# Patient Record
Sex: Female | Born: 1970 | Race: Black or African American | Hispanic: No | Marital: Married | Smoking: Never smoker
Health system: Southern US, Community
[De-identification: ages and names within clinical notes are randomized; demographics above are authoritative.]

## PROBLEM LIST (undated history)

## (undated) ENCOUNTER — Emergency Department (HOSPITAL_COMMUNITY): Admission: EM | Payer: Self-pay | Source: Home / Self Care

## (undated) DIAGNOSIS — I10 Essential (primary) hypertension: Secondary | ICD-10-CM

## (undated) DIAGNOSIS — B1910 Unspecified viral hepatitis B without hepatic coma: Secondary | ICD-10-CM

## (undated) HISTORY — DX: Unspecified viral hepatitis B without hepatic coma: B19.10

---

## 1999-06-20 ENCOUNTER — Emergency Department (HOSPITAL_COMMUNITY): Admission: EM | Admit: 1999-06-20 | Discharge: 1999-06-20 | Payer: Self-pay | Admitting: Emergency Medicine

## 1999-07-25 ENCOUNTER — Encounter: Admission: RE | Admit: 1999-07-25 | Discharge: 1999-07-25 | Payer: Self-pay | Admitting: Internal Medicine

## 1999-08-24 ENCOUNTER — Encounter: Admission: RE | Admit: 1999-08-24 | Discharge: 1999-08-24 | Payer: Self-pay | Admitting: Internal Medicine

## 1999-09-02 ENCOUNTER — Encounter: Admission: RE | Admit: 1999-09-02 | Discharge: 1999-09-02 | Payer: Self-pay | Admitting: Internal Medicine

## 2000-02-20 ENCOUNTER — Emergency Department (HOSPITAL_COMMUNITY): Admission: EM | Admit: 2000-02-20 | Discharge: 2000-02-20 | Payer: Self-pay | Admitting: Emergency Medicine

## 2000-02-20 ENCOUNTER — Encounter: Payer: Self-pay | Admitting: Emergency Medicine

## 2000-03-23 ENCOUNTER — Encounter: Admission: RE | Admit: 2000-03-23 | Discharge: 2000-03-23 | Payer: Self-pay | Admitting: Internal Medicine

## 2000-04-05 ENCOUNTER — Other Ambulatory Visit: Admission: RE | Admit: 2000-04-05 | Discharge: 2000-04-05 | Payer: Self-pay | Admitting: Gynecology

## 2000-10-30 ENCOUNTER — Inpatient Hospital Stay (HOSPITAL_COMMUNITY): Admission: AD | Admit: 2000-10-30 | Discharge: 2000-11-01 | Payer: Self-pay | Admitting: Obstetrics and Gynecology

## 2001-01-09 ENCOUNTER — Encounter: Admission: RE | Admit: 2001-01-09 | Discharge: 2001-01-09 | Payer: Self-pay | Admitting: Family Medicine

## 2002-02-26 ENCOUNTER — Encounter: Admission: RE | Admit: 2002-02-26 | Discharge: 2002-02-26 | Payer: Self-pay | Admitting: Family Medicine

## 2002-11-27 ENCOUNTER — Encounter: Admission: RE | Admit: 2002-11-27 | Discharge: 2002-11-27 | Payer: Self-pay | Admitting: Family Medicine

## 2002-12-26 ENCOUNTER — Encounter: Admission: RE | Admit: 2002-12-26 | Discharge: 2002-12-26 | Payer: Self-pay | Admitting: Family Medicine

## 2003-01-16 ENCOUNTER — Encounter: Admission: RE | Admit: 2003-01-16 | Discharge: 2003-01-16 | Payer: Self-pay | Admitting: Family Medicine

## 2003-02-16 ENCOUNTER — Ambulatory Visit (HOSPITAL_COMMUNITY): Admission: RE | Admit: 2003-02-16 | Discharge: 2003-02-16 | Payer: Self-pay | Admitting: Family Medicine

## 2003-02-16 ENCOUNTER — Encounter: Admission: RE | Admit: 2003-02-16 | Discharge: 2003-02-16 | Payer: Self-pay | Admitting: Family Medicine

## 2003-03-17 ENCOUNTER — Encounter: Admission: RE | Admit: 2003-03-17 | Discharge: 2003-03-17 | Payer: Self-pay | Admitting: Family Medicine

## 2003-05-06 ENCOUNTER — Encounter: Admission: RE | Admit: 2003-05-06 | Discharge: 2003-05-06 | Payer: Self-pay | Admitting: Family Medicine

## 2003-05-08 ENCOUNTER — Encounter: Admission: RE | Admit: 2003-05-08 | Discharge: 2003-05-08 | Payer: Self-pay | Admitting: Family Medicine

## 2003-05-14 ENCOUNTER — Encounter: Admission: RE | Admit: 2003-05-14 | Discharge: 2003-05-14 | Payer: Self-pay | Admitting: Sports Medicine

## 2003-05-20 ENCOUNTER — Encounter: Admission: RE | Admit: 2003-05-20 | Discharge: 2003-05-20 | Payer: Self-pay | Admitting: Family Medicine

## 2003-06-05 ENCOUNTER — Encounter: Admission: RE | Admit: 2003-06-05 | Discharge: 2003-06-05 | Payer: Self-pay | Admitting: Family Medicine

## 2003-06-10 ENCOUNTER — Encounter: Admission: RE | Admit: 2003-06-10 | Discharge: 2003-06-10 | Payer: Self-pay | Admitting: Family Medicine

## 2003-06-12 ENCOUNTER — Ambulatory Visit (HOSPITAL_COMMUNITY): Admission: RE | Admit: 2003-06-12 | Discharge: 2003-06-12 | Payer: Self-pay | Admitting: Family Medicine

## 2003-06-16 ENCOUNTER — Encounter: Admission: RE | Admit: 2003-06-16 | Discharge: 2003-06-16 | Payer: Self-pay | Admitting: Family Medicine

## 2003-06-24 ENCOUNTER — Encounter: Admission: RE | Admit: 2003-06-24 | Discharge: 2003-06-24 | Payer: Self-pay | Admitting: Family Medicine

## 2003-06-30 ENCOUNTER — Inpatient Hospital Stay (HOSPITAL_COMMUNITY): Admission: AD | Admit: 2003-06-30 | Discharge: 2003-06-30 | Payer: Self-pay | Admitting: *Deleted

## 2003-07-07 ENCOUNTER — Encounter: Admission: RE | Admit: 2003-07-07 | Discharge: 2003-07-07 | Payer: Self-pay | Admitting: Family Medicine

## 2003-07-11 ENCOUNTER — Inpatient Hospital Stay (HOSPITAL_COMMUNITY): Admission: AD | Admit: 2003-07-11 | Discharge: 2003-07-13 | Payer: Self-pay | Admitting: *Deleted

## 2003-08-26 ENCOUNTER — Encounter: Admission: RE | Admit: 2003-08-26 | Discharge: 2003-08-26 | Payer: Self-pay | Admitting: Family Medicine

## 2003-09-11 ENCOUNTER — Encounter: Admission: RE | Admit: 2003-09-11 | Discharge: 2003-09-11 | Payer: Self-pay | Admitting: Sports Medicine

## 2003-11-13 ENCOUNTER — Encounter: Admission: RE | Admit: 2003-11-13 | Discharge: 2003-11-13 | Payer: Self-pay | Admitting: Family Medicine

## 2004-02-17 ENCOUNTER — Encounter: Admission: RE | Admit: 2004-02-17 | Discharge: 2004-02-17 | Payer: Self-pay | Admitting: Family Medicine

## 2004-02-23 ENCOUNTER — Encounter (INDEPENDENT_AMBULATORY_CARE_PROVIDER_SITE_OTHER): Payer: Self-pay | Admitting: *Deleted

## 2004-03-22 ENCOUNTER — Encounter: Admission: RE | Admit: 2004-03-22 | Discharge: 2004-03-22 | Payer: Self-pay | Admitting: Sports Medicine

## 2004-03-22 ENCOUNTER — Encounter (INDEPENDENT_AMBULATORY_CARE_PROVIDER_SITE_OTHER): Payer: Self-pay | Admitting: Specialist

## 2004-03-31 ENCOUNTER — Encounter: Admission: RE | Admit: 2004-03-31 | Discharge: 2004-03-31 | Payer: Self-pay | Admitting: Family Medicine

## 2004-05-05 ENCOUNTER — Encounter: Admission: RE | Admit: 2004-05-05 | Discharge: 2004-05-05 | Payer: Self-pay | Admitting: Sports Medicine

## 2004-05-10 ENCOUNTER — Encounter: Admission: RE | Admit: 2004-05-10 | Discharge: 2004-05-10 | Payer: Self-pay | Admitting: Family Medicine

## 2005-02-08 ENCOUNTER — Ambulatory Visit: Payer: Self-pay | Admitting: Sports Medicine

## 2005-02-24 ENCOUNTER — Encounter: Admission: RE | Admit: 2005-02-24 | Discharge: 2005-02-24 | Payer: Self-pay | Admitting: Sports Medicine

## 2005-02-24 ENCOUNTER — Ambulatory Visit: Payer: Self-pay | Admitting: Family Medicine

## 2005-09-05 ENCOUNTER — Ambulatory Visit: Payer: Self-pay | Admitting: Family Medicine

## 2007-01-26 ENCOUNTER — Emergency Department (HOSPITAL_COMMUNITY): Admission: EM | Admit: 2007-01-26 | Discharge: 2007-01-27 | Payer: Self-pay | Admitting: Emergency Medicine

## 2007-02-22 ENCOUNTER — Encounter (INDEPENDENT_AMBULATORY_CARE_PROVIDER_SITE_OTHER): Payer: Self-pay | Admitting: *Deleted

## 2007-11-05 ENCOUNTER — Emergency Department (HOSPITAL_COMMUNITY): Admission: EM | Admit: 2007-11-05 | Discharge: 2007-11-05 | Payer: Self-pay | Admitting: Emergency Medicine

## 2008-03-30 ENCOUNTER — Emergency Department (HOSPITAL_COMMUNITY): Admission: EM | Admit: 2008-03-30 | Discharge: 2008-03-30 | Payer: Self-pay | Admitting: Emergency Medicine

## 2008-09-02 ENCOUNTER — Telehealth: Payer: Self-pay | Admitting: *Deleted
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
## 2010-11-23 ENCOUNTER — Emergency Department (HOSPITAL_COMMUNITY)
Admission: EM | Admit: 2010-11-23 | Discharge: 2010-11-23 | Payer: Self-pay | Source: Home / Self Care | Admitting: Emergency Medicine

## 2011-01-21 ENCOUNTER — Emergency Department (HOSPITAL_COMMUNITY)
Admission: EM | Admit: 2011-01-21 | Discharge: 2011-01-21 | Payer: Self-pay | Source: Home / Self Care | Admitting: Emergency Medicine

## 2011-01-21 LAB — POCT I-STAT, CHEM 8
BUN: 10 mg/dL (ref 6–23)
Calcium, Ion: 1.19 mmol/L (ref 1.12–1.32)
Chloride: 107 mEq/L (ref 96–112)
Creatinine, Ser: 1.1 mg/dL (ref 0.4–1.2)
TCO2: 24 mmol/L (ref 0–100)

## 2011-10-03 LAB — POCT PREGNANCY, URINE: Preg Test, Ur: NEGATIVE

## 2011-10-10 ENCOUNTER — Inpatient Hospital Stay (INDEPENDENT_AMBULATORY_CARE_PROVIDER_SITE_OTHER)
Admission: RE | Admit: 2011-10-10 | Discharge: 2011-10-10 | Disposition: A | Discharge status: Home / Self Care | Payer: PRIVATE HEALTH INSURANCE | Source: Ambulatory Visit | Attending: Family Medicine | Admitting: Family Medicine

## 2011-10-10 DIAGNOSIS — I10 Essential (primary) hypertension: Secondary | ICD-10-CM

## 2011-10-10 LAB — POCT URINALYSIS DIP (DEVICE)
Hgb urine dipstick: NEGATIVE
Leukocytes, UA: NEGATIVE

## 2011-10-10 LAB — POCT I-STAT, CHEM 8
BUN: 13 mg/dL (ref 6–23)
Calcium, Ion: 1.22 mmol/L (ref 1.12–1.32)
Creatinine, Ser: 0.8 mg/dL (ref 0.50–1.10)
HCT: 39 % (ref 36.0–46.0)

## 2011-12-04 ENCOUNTER — Emergency Department (INDEPENDENT_AMBULATORY_CARE_PROVIDER_SITE_OTHER)
Admission: EM | Admit: 2011-12-04 | Discharge: 2011-12-04 | Disposition: A | Discharge status: Home / Self Care | Payer: PRIVATE HEALTH INSURANCE | Source: Home / Self Care | Attending: Family Medicine | Admitting: Family Medicine

## 2011-12-04 DIAGNOSIS — J329 Chronic sinusitis, unspecified: Secondary | ICD-10-CM

## 2011-12-04 HISTORY — DX: Essential (primary) hypertension: I10

## 2011-12-04 MED ORDER — GUAIFENESIN-CODEINE 100-10 MG/5ML PO SYRP: 10.0000 mL | ORAL_SOLUTION | Freq: Three times a day (TID) | ORAL | Status: AC | PRN

## 2011-12-04 MED ORDER — AZITHROMYCIN 250 MG PO TABS: ORAL_TABLET | ORAL | Status: AC

## 2011-12-04 MED ORDER — IPRATROPIUM BROMIDE 0.06 % NA SOLN: 2.0000 | Freq: Four times a day (QID) | NASAL | Status: DC

## 2011-12-04 NOTE — ED Provider Notes (Signed)
History     CSN: [id number] Arrival date & time: 12/04/2011  5:58 PM   First MD Initiated Contact with Patient 12/04/11 1725      No chief complaint on file.   (Consider location/radiation/quality/duration/timing/severity/associated sxs/prior treatment) Patient is a 40 y.o. female presenting with cough.  Cough This is a new problem. Episode onset: 4 weeks. The problem occurs constantly. The problem has not changed since onset.The cough is productive of purulent sputum. There has been no fever. Associated symptoms include rhinorrhea. Pertinent negatives include no shortness of breath and no wheezing. She has tried decongestants and cough syrup for the symptoms. The treatment provided mild relief. She is not a smoker.    No past medical history on file.  No past surgical history on file.  No family history on file.  History  Substance Use Topics  . Smoking status: Not on file  . Smokeless tobacco: Not on file  . Alcohol Use: Not on file    OB History    No data available      Review of Systems  Constitutional: Negative.   HENT: Positive for congestion, rhinorrhea and postnasal drip.   Respiratory: Positive for cough. Negative for shortness of breath and wheezing.   Cardiovascular: Negative.   Gastrointestinal: Negative for nausea, vomiting and diarrhea.  Skin: Negative.     Allergies  Review of patient's allergies indicates not on file.  Home Medications   Current Outpatient Rx  Name Route Sig Dispense Refill  . AZITHROMYCIN 250 MG PO TABS  Take as directed on pack 6 each 0  . GUAIFENESIN-CODEINE 100-10 MG/5ML PO SYRP Oral Take 10 mLs by mouth 3 (three) times daily as needed for cough. 180 mL 0  . IPRATROPIUM BROMIDE 0.06 % NA SOLN Nasal Place 2 sprays into the nose 4 (four) times daily. 15 mL 12    There were no vitals taken for this visit.  Physical Exam  Nursing note and vitals reviewed. Constitutional: She appears well-developed and well-nourished.    HENT:  Head: Normocephalic.  Right Ear: External ear normal.  Left Ear: External ear normal.  Mouth/Throat: Oropharynx is clear and moist.  Eyes: Conjunctivae and EOM are normal. Pupils are equal, round, and reactive to light.  Neck: Normal range of motion. Neck supple.  Cardiovascular: Normal rate, normal heart sounds and intact distal pulses.   Pulmonary/Chest: Effort normal and breath sounds normal. She has no wheezes. She has no rales.  Abdominal: Soft. Bowel sounds are normal.  Skin: Skin is warm and dry.    ED Course  Procedures (including critical care time)  Labs Reviewed - No data to display No results found.   1. Sinusitis       MDM          Barkley Bruns, MD 12/04/11 [phone]

## 2011-12-04 NOTE — ED Notes (Signed)
Sick for 1 month

## 2013-01-01 ENCOUNTER — Encounter (HOSPITAL_COMMUNITY): Payer: Self-pay

## 2013-01-01 ENCOUNTER — Emergency Department (HOSPITAL_COMMUNITY)
Admission: EM | Admit: 2013-01-01 | Discharge: 2013-01-01 | Disposition: A | Discharge status: Home / Self Care | Payer: Commercial Managed Care - PPO | Attending: Emergency Medicine | Admitting: Emergency Medicine

## 2013-01-01 ENCOUNTER — Emergency Department (HOSPITAL_COMMUNITY): Payer: Commercial Managed Care - PPO

## 2013-01-01 DIAGNOSIS — I10 Essential (primary) hypertension: Secondary | ICD-10-CM | POA: Insufficient documentation

## 2013-01-01 DIAGNOSIS — R071 Chest pain on breathing: Secondary | ICD-10-CM | POA: Insufficient documentation

## 2013-01-01 DIAGNOSIS — K219 Gastro-esophageal reflux disease without esophagitis: Secondary | ICD-10-CM

## 2013-01-01 MED ORDER — HYDROCODONE-ACETAMINOPHEN 5-325 MG PO TABS: 1.0000 | ORAL_TABLET | Freq: Four times a day (QID) | ORAL | Status: DC | PRN

## 2013-01-01 MED ORDER — GI COCKTAIL ~~LOC~~
30.0000 mL | Freq: Once | ORAL | Status: AC
  Administered 2013-01-01: 30 mL via ORAL
  Filled 2013-01-01: qty 30

## 2013-01-01 MED ORDER — LANSOPRAZOLE 30 MG PO CPDR: 30.0000 mg | DELAYED_RELEASE_CAPSULE | Freq: Every day | ORAL | Status: DC

## 2013-01-01 NOTE — ED Notes (Signed)
Pt sts that it is indigestion, hurts when she tries to eat any kind of food, and makes her burp.

## 2013-01-01 NOTE — ED Notes (Signed)
Pt reports "there is still this 'scraping-feeling' in my chest when I swallow".

## 2013-01-01 NOTE — ED Notes (Signed)
Pt states that when she tries to swallow it is difficult.  Causing epigastric pain with food.  Has worsened today.  Feels that food is not going down the way it should. No reflux meds.  No pain down either arm.  Pain does go to the back but nothing down arms.

## 2013-01-01 NOTE — ED Provider Notes (Signed)
History    CSN: [id number] Arrival date & time 01/01/13  2130 First MD Initiated Contact with Patient 01/01/13 2134      Chief Complaint  Patient presents with  . Dysphagia  . Gastrophageal Reflux    HPI Pt has been having trouble with pain with swallowing since yesterday.  She also has been having some pain with breathing.  The symptoms get worse when she tries to eat or lay flat.  Drinking and eating makes her feel like she has to burp.  No vomiting or diarrhea.  No abdominal pain.  No fever.  No shortness of breath.  She has had this in the past but has never had as much trouble swallowing as she does today.  Past Medical History  Diagnosis Date  . Hypertension     History reviewed. No pertinent past surgical history.  History reviewed. No pertinent family history.  History  Substance Use Topics  . Smoking status: Never Smoker   . Smokeless tobacco: Not on file  . Alcohol Use: No    OB History    Grav Para Term Preterm Abortions TAB SAB Ect Mult Living                  Review of Systems  All other systems reviewed and are negative.    Allergies  Penicillins  Home Medications   Current Outpatient Rx  Name  Route  Sig  Dispense  Refill  . ADULT MULTIVITAMIN W/MINERALS CH   Oral   Take 1 tablet by mouth daily.           BP 157/90  Pulse 74  Temp 98.3 F (36.8 C) (Oral)  Resp 20  Ht 5\' 5"  (1.651 m)  Wt 195 lb (88.451 kg)  BMI 32.45 kg/m2  SpO2 100%  LMP 12/27/2012  Physical Exam  Nursing note and vitals reviewed. Constitutional: She appears well-developed and well-nourished. No distress.  HENT:  Head: Normocephalic and atraumatic.  Right Ear: External ear normal.  Left Ear: External ear normal.  Mouth/Throat: No oropharyngeal exudate.  Eyes: Conjunctivae normal are normal. Right eye exhibits no discharge. Left eye exhibits no discharge. No scleral icterus.  Neck: Neck supple. No tracheal deviation present.  Cardiovascular: Normal rate, regular  rhythm and intact distal pulses.   Pulmonary/Chest: Effort normal and breath sounds normal. No stridor. No respiratory distress. She has no wheezes. She has no rales.  Abdominal: Soft. Bowel sounds are normal. She exhibits no distension. There is no tenderness. There is no rebound and no guarding.  Musculoskeletal: She exhibits no edema and no tenderness.  Neurological: She is alert. She has normal strength. No sensory deficit. Cranial nerve deficit:  no gross defecits noted. She exhibits normal muscle tone. She displays no seizure activity. Coordination normal.  Skin: Skin is warm and dry. No rash noted.  Psychiatric: She has a normal mood and affect.    ED Course  Procedures (including critical care time)  Rate: 67  Rhythm: normal sinus rhythm  QRS Axis: normal  Intervals: first defree av block  ST/T Wave abnormalities: normal  Conduction Disutrbances:none  Narrative Interpretation:   Old EKG Reviewed: none available  Labs Reviewed - No data to display Dg Chest 2 View  01/01/2013  *RADIOLOGY REPORT*  Clinical Data: Chest pain.  CHEST - 2 VIEW  Comparison: Single view of the chest 06/03/2008.  Findings: Lungs are clear.  Heart size is normal.  No pneumothorax or pleural fluid.  IMPRESSION: Negative chest.  Original Report Authenticated By: Holley Dexter, M.D.      1. GERD (gastroesophageal reflux disease)       MDM  The patient's symptoms are suggestive of esophagitis/gastroesophageal reflux disease. Her EKG is unremarkable. Her chest x-ray is unremarkable as well.  She has a normal mediastinum. I doubt aortic dissection.  Patient was prescribed antacid medications. She also requested refill of her blood pressure medications        Celene Kras, MD 01/01/13 2325

## 2013-01-13 ENCOUNTER — Encounter (HOSPITAL_COMMUNITY): Payer: Self-pay | Admitting: *Deleted

## 2013-01-13 ENCOUNTER — Emergency Department (HOSPITAL_COMMUNITY)
Admission: EM | Admit: 2013-01-13 | Discharge: 2013-01-13 | Disposition: A | Discharge status: Home / Self Care | Payer: Commercial Managed Care - PPO | Attending: Emergency Medicine | Admitting: Emergency Medicine

## 2013-01-13 DIAGNOSIS — M26609 Unspecified temporomandibular joint disorder, unspecified side: Secondary | ICD-10-CM | POA: Insufficient documentation

## 2013-01-13 DIAGNOSIS — I10 Essential (primary) hypertension: Secondary | ICD-10-CM | POA: Insufficient documentation

## 2013-01-13 DIAGNOSIS — R51 Headache: Secondary | ICD-10-CM | POA: Insufficient documentation

## 2013-01-13 DIAGNOSIS — Z79899 Other long term (current) drug therapy: Secondary | ICD-10-CM | POA: Insufficient documentation

## 2013-01-13 DIAGNOSIS — R6884 Jaw pain: Secondary | ICD-10-CM | POA: Insufficient documentation

## 2013-01-13 MED ORDER — PREDNISONE 50 MG PO TABS: 50.0000 mg | ORAL_TABLET | Freq: Every day | ORAL | Status: DC

## 2013-01-13 MED ORDER — CYCLOBENZAPRINE HCL 10 MG PO TABS: 10.0000 mg | ORAL_TABLET | Freq: Three times a day (TID) | ORAL | Status: DC | PRN

## 2013-01-13 MED ORDER — OXYCODONE-ACETAMINOPHEN 5-325 MG PO TABS: 1.0000 | ORAL_TABLET | Freq: Four times a day (QID) | ORAL | Status: DC | PRN

## 2013-01-13 NOTE — ED Notes (Signed)
Pt reports right sided face pain around jaw and inside gums since Friday. Has used orajel without relief.

## 2013-01-13 NOTE — ED Provider Notes (Addendum)
History     CSN: [id number]  Arrival date & time 01/13/13  1157   First MD Initiated Contact with Patient 01/13/13 1349      Chief Complaint  Patient presents with  . Dental Pain    (Consider location/radiation/quality/duration/timing/severity/associated sxs/prior treatment) HPI Patient presents to the emergency department with right jaw and facial pain.  Patient states the pain radiates from her upper jaw and down to her face and teeth.  Patient denies nausea, vomiting, fevers, chest pain, shortness of breath, neck pain, difficulty swallowing, blurred vision, fever, or dizziness.  Patient states the pain is not constant, but seems to be intermittent.  Patient states that she's taken left over pain medication without relief.  Patient denies specific dental pain Past Medical History  Diagnosis Date  . Hypertension     History reviewed. No pertinent past surgical history.  No family history on file.  History  Substance Use Topics  . Smoking status: Never Smoker   . Smokeless tobacco: Not on file  . Alcohol Use: No    OB History    Grav Para Term Preterm Abortions TAB SAB Ect Mult Living                  Review of Systems All other systems negative except as documented in the HPI. All pertinent positives and negatives as reviewed in the HPI. Allergies  Penicillins  Home Medications   Current Outpatient Rx  Name  Route  Sig  Dispense  Refill  . HYDROCHLOROTHIAZIDE 25 MG PO TABS   Oral   Take 25 mg by mouth daily.         Marland Kitchen HYDROCODONE-ACETAMINOPHEN 5-325 MG PO TABS   Oral   Take 1-2 tablets by mouth every 6 (six) hours as needed for pain.   16 tablet   0   . LANSOPRAZOLE 30 MG PO CPDR   Oral   Take 1 capsule (30 mg total) by mouth daily.   14 capsule   1   . ADULT MULTIVITAMIN W/MINERALS CH   Oral   Take 1 tablet by mouth daily.           BP 146/83  Pulse 66  Temp 98.2 F (36.8 C) (Oral)  Resp 16  SpO2 99%  LMP 12/27/2012  Physical Exam    Nursing note and vitals reviewed. Constitutional: She is oriented to person, place, and time. She appears well-developed and well-nourished.  HENT:  Head: Normocephalic and atraumatic. No trismus in the jaw.  Mouth/Throat: Oropharynx is clear and moist. No oral lesions. Normal dentition. Dental caries present. No uvula swelling. No oropharyngeal exudate.  Neck: Normal range of motion. Neck supple.  Cardiovascular: Normal rate, regular rhythm and normal heart sounds.   Pulmonary/Chest: Effort normal and breath sounds normal. She has no wheezes.  Neurological: She is alert and oriented to person, place, and time.  Skin: Skin is warm and dry. No rash noted.    ED Course  Procedures (including critical care time)  Patient will be advised to return here for any worsening in her condition.  Patient is advised to use heat on the area of her upper jaw.  Field.  This is a TMJ type situation, based on her description of the pain, and intermittent nature.  Patient states that chewing does seem to make the pain worse.  MDM          Carlyle Dolly, PA-C 01/13/13 1418  Carlyle Dolly, PA-C 01/13/13 1424  Cristal Deer  Gabriel Earing, PA-C 01/13/13 1424

## 2013-01-13 NOTE — ED Provider Notes (Signed)
Medical screening examination/treatment/procedure(s) were performed by non-physician practitioner and as supervising physician I was immediately available for consultation/collaboration.   Celene Kras, MD 01/13/13 [phone]

## 2013-01-13 NOTE — ED Provider Notes (Signed)
Medical screening examination/treatment/procedure(s) were performed by non-physician practitioner and as supervising physician I was immediately available for consultation/collaboration.   Celene Kras, MD 01/13/13 [phone]

## 2013-02-04 ENCOUNTER — Ambulatory Visit (INDEPENDENT_AMBULATORY_CARE_PROVIDER_SITE_OTHER): Payer: Commercial Managed Care - PPO | Admitting: Family Medicine

## 2013-02-04 ENCOUNTER — Encounter: Payer: Self-pay | Admitting: Family Medicine

## 2013-02-04 VITALS — BP 150/91 | HR 71 | Ht 65.0 in | Wt 212.0 lb

## 2013-02-04 DIAGNOSIS — E669 Obesity, unspecified: Secondary | ICD-10-CM

## 2013-02-04 DIAGNOSIS — I10 Essential (primary) hypertension: Secondary | ICD-10-CM

## 2013-02-04 DIAGNOSIS — Z3009 Encounter for other general counseling and advice on contraception: Secondary | ICD-10-CM

## 2013-02-04 MED ORDER — METOPROLOL SUCCINATE ER 25 MG PO TB24: 25.0000 mg | ORAL_TABLET | Freq: Every day | ORAL | Status: DC

## 2013-02-04 NOTE — Patient Instructions (Addendum)
It was nice to meet you.  Your blood pressure today was BP: 150/91 mmHg.  Remember your goal blood pressure is about 130/80.  Please be sure to take both your blood pressure medications every day.    To help you work on improving your nutrition, remember the plate rule for each meal:  - 1/4 of the plate or less should be a whole grain starch (brown rice, whole grain pasta, wheat bread, etc.)  - 1/4 of the plate should be a lean source of protein (chicken, [place], fish, beans, egg whites).  - 1/2 the plate or more should be fruits and vegetables - the more vegetables the better!   Also, try to increase your activity and exercise.   Please come back and see me in about one month to check on your blood pressure.

## 2013-02-06 DIAGNOSIS — I10 Essential (primary) hypertension: Secondary | ICD-10-CM | POA: Insufficient documentation

## 2013-02-06 DIAGNOSIS — Z3009 Encounter for other general counseling and advice on contraception: Secondary | ICD-10-CM | POA: Insufficient documentation

## 2013-02-06 DIAGNOSIS — E669 Obesity, unspecified: Secondary | ICD-10-CM | POA: Insufficient documentation

## 2013-02-06 NOTE — Assessment & Plan Note (Signed)
Discussed that given age, obesity, and HTN, pregnancy could be dangerous for her.  Discussed problems like pre-eclampsia and eclampsia, danger to fetus and mother, including death.  Recommended better control of BP and weight loss prior to trying to become pregnant.

## 2013-02-06 NOTE — Assessment & Plan Note (Signed)
Discussed exercise and diet modification and importance of heathy weight for healthy pregnancy.

## 2013-02-06 NOTE — Assessment & Plan Note (Signed)
Not controlled on HCTZ, will add beta blocker in setting of possible pregnancy in near future.  F/U in one month.

## 2013-02-06 NOTE — Progress Notes (Signed)
  Subjective:    Patient ID: Stephanie Dorsey, female    DOB: 07-May-1971, 42 y.o.   MRN: [id number]  HPI: Stephanie Dorsey presents to establish care.  Her main concern today is that her husband is interested in having another baby, and she is wondering if she is healthy enough for pregnancy. She says she still has regular periods, and is not using contraception.  She does not know how old her mother was when she went through menopause.   HTN: patient is taking HCTZ for her blood pressure.  She has not had it checked in a while.  She denies any chest pain, dyspnea, LE edema, palpitations.   Obesity: patient knew she was overweight, but not obese.  She does not get much exercise but says she thinks she eats a healthy diet.    Past Medical History  Diagnosis Date  . Hypertension   . Hepatitis B     History  Substance Use Topics  . Smoking status: Never Smoker   . Smokeless tobacco: Never Used  . Alcohol Use: No    Family History  Problem Relation Age of Onset  . Arthritis Mother   . Hyperlipidemia Sister   . Arthritis Maternal Grandmother    ROS Pertinent items in HPI    Objective:  Physical Exam:  BP 150/91  Pulse 71  Ht 5\' 5"  (1.651 m)  Wt 212 lb (96.163 kg)  BMI 35.28 kg/m2 General appearance: alert, cooperative and no distress, mildly obese Head: Normocephalic, without obvious abnormality, atraumatic Lungs: clear to auscultation bilaterally Heart: regular rate and rhythm, S1, S2 normal, no murmur, click, rub or gallop ABD: + BS, NT/ND.  Pulses: 2+ and symmetric       Assessment & Plan:

## 2013-02-20 ENCOUNTER — Telehealth: Payer: Self-pay | Admitting: Family Medicine

## 2013-02-20 NOTE — Telephone Encounter (Signed)
Will forward to Dr. Chamberlain 

## 2013-02-20 NOTE — Telephone Encounter (Signed)
Pt is needing refill on HCTZ - hasn't been prescribed by [facility] before  [facility]

## 2013-02-21 ENCOUNTER — Telehealth: Payer: Self-pay | Admitting: Family Medicine

## 2013-02-21 DIAGNOSIS — I10 Essential (primary) hypertension: Secondary | ICD-10-CM

## 2013-02-21 MED ORDER — HYDROCHLOROTHIAZIDE 25 MG PO TABS: 25.0000 mg | ORAL_TABLET | Freq: Every day | ORAL | Status: DC

## 2013-02-21 NOTE — Assessment & Plan Note (Signed)
Will refill as requested.

## 2013-02-21 NOTE — Telephone Encounter (Addendum)
Will forward to Dr. Lula Olszewski. Paged  MD.

## 2013-02-21 NOTE — Telephone Encounter (Signed)
Patient is calling back because she called yesterday about a refill on hr BP meds and there was no reply and the encounter was closed.  She does not have enough to last through the weekend.  [facility] uses [facility] [address].

## 2013-02-21 NOTE — Telephone Encounter (Signed)
Patient is out of HCTZ.  This has not been prescribed through our office previously. Will send message to preceptor to please refill.

## 2013-11-12 ENCOUNTER — Other Ambulatory Visit: Payer: Self-pay | Admitting: Family Medicine

## 2014-01-14 ENCOUNTER — Other Ambulatory Visit (HOSPITAL_COMMUNITY)
Admission: RE | Admit: 2014-01-14 | Discharge: 2014-01-14 | Disposition: A | Discharge status: Home / Self Care | Payer: Commercial Managed Care - PPO | Source: Ambulatory Visit | Attending: Family Medicine | Admitting: Family Medicine

## 2014-01-14 ENCOUNTER — Encounter: Payer: Self-pay | Admitting: Family Medicine

## 2014-01-14 ENCOUNTER — Ambulatory Visit (INDEPENDENT_AMBULATORY_CARE_PROVIDER_SITE_OTHER): Payer: Commercial Managed Care - PPO | Admitting: Family Medicine

## 2014-01-14 VITALS — BP 117/72 | HR 72 | Temp 98.6°F | Ht 65.0 in | Wt 222.0 lb

## 2014-01-14 DIAGNOSIS — Z124 Encounter for screening for malignant neoplasm of cervix: Secondary | ICD-10-CM

## 2014-01-14 DIAGNOSIS — Z1151 Encounter for screening for human papillomavirus (HPV): Secondary | ICD-10-CM | POA: Insufficient documentation

## 2014-01-14 DIAGNOSIS — Z3009 Encounter for other general counseling and advice on contraception: Secondary | ICD-10-CM

## 2014-01-14 DIAGNOSIS — E669 Obesity, unspecified: Secondary | ICD-10-CM

## 2014-01-14 DIAGNOSIS — I10 Essential (primary) hypertension: Secondary | ICD-10-CM

## 2014-01-14 DIAGNOSIS — R011 Cardiac murmur, unspecified: Secondary | ICD-10-CM

## 2014-01-14 DIAGNOSIS — N920 Excessive and frequent menstruation with regular cycle: Secondary | ICD-10-CM

## 2014-01-14 DIAGNOSIS — Z01419 Encounter for gynecological examination (general) (routine) without abnormal findings: Secondary | ICD-10-CM | POA: Insufficient documentation

## 2014-01-14 MED ORDER — HYDROCHLOROTHIAZIDE 25 MG PO TABS: 25.0000 mg | ORAL_TABLET | Freq: Every day | ORAL | Status: DC

## 2014-01-14 MED ORDER — METOPROLOL SUCCINATE ER 25 MG PO TB24: ORAL_TABLET | ORAL | Status: DC

## 2014-01-14 NOTE — Patient Instructions (Addendum)
Blood pressure looks great today. See Stephanie Dorsey back in 6 months.   Please consider meeting with Dr. Gerilyn PilgrimSykes as I think we could help prevent your blood pressure from increasing further in the future if we work on your food choices and activity level.   Regarding pap smear-For your labs, I will send you a letter if there are no medication changes needed. I will call you if we need to discuss your lab results. I am worried that we did not get a good sample of your pap. We may need to repeat but we will plan on doing that in 6 months if not adequate or sooner if you desire.   I want you to come back for fasting lab work-cholesterol, blood counts, kidney liver. Call for a lab visit before coming.     Thanks, Dr. Durene CalHunter

## 2014-01-15 DIAGNOSIS — R011 Cardiac murmur, unspecified: Secondary | ICD-10-CM | POA: Insufficient documentation

## 2014-01-15 NOTE — Assessment & Plan Note (Addendum)
Weight trending up 27 lbs in a year. I advised patient she would benefit from seeing Dr. Gerilyn PilgrimSykes with me on Thursday Mornings but she is unable to do so due to work schedule. Will have blue team call to see if patient has any other times in the week that may work and I can work with Dr. Gerilyn PilgrimSykes to see if we can fit her in. Patient states has no time for exercise given her kids and work schedule.

## 2014-01-15 NOTE — Assessment & Plan Note (Signed)
Using condoms and no longer trying to get pregnant.

## 2014-01-15 NOTE — Progress Notes (Signed)
Stephanie Conch, MD Phone: [phone]  Subjective:  Chief complaint-noted  Hypertension BP Readings from Last 3 Encounters:  01/14/14 117/72  02/04/13 150/91  01/13/13 146/83  Home BP monitoring-no Compliant with medications-yes without side effects ROS-Denies any chest pain or shortness of breath or edema  Past Medical History-obesity, hypertension  Medications- reviewed and updated Current Outpatient Prescriptions  Medication Sig Dispense Refill  . cyclobenzaprine (FLEXERIL) 10 MG tablet Take 1 tablet (10 mg total) by mouth 3 (three) times daily as needed for muscle spasms.  15 tablet  0  . hydrochlorothiazide (HYDRODIURIL) 25 MG tablet Take 1 tablet (25 mg total) by mouth daily.  90 tablet  3  . metoprolol succinate (TOPROL-XL) 25 MG 24 hr tablet TAKE 1 TABLET (25 MG TOTAL) BY MOUTH DAILY.  90 tablet  3  . Multiple Vitamin (MULTIVITAMIN WITH MINERALS) TABS Take 1 tablet by mouth daily.       No current facility-administered medications for this visit.    Objective: BP 117/72  Pulse 72  Temp(Src) 98.6 F (37 C) (Oral)  Ht 5\' 5"  (1.651 m)  Wt 222 lb (100.699 kg)  BMI 36.94 kg/m2 Gen: NAD, resting comfortably on table  CV: RRR. 2/6 SEM at LUSB crescendo decrescendo, does not radiate Lungs: CTAB no crackles, wheeze, rhonchi Ext: trace edema Pelvic: patient with difficulty in relaxing legs. External genitalia normal. Could only intermittently see the opening of the cervix due to patient tension. Pap attempted but unsure of adequate specimen-will await results.   Assessment/Plan:  HTN (hypertension) Well controlled. Continue current meds: toprol XL 25mg  and HCTZ 25mg . Asked patient to return for fasting labwork.     Family planning counseling Using condoms and no longer trying to get pregnant.   Obesity (BMI 30-39.9) Weight trending up 27 lbs in a year. I advised patient she would benefit from seeing Dr. Gerilyn Pilgrim with me on Thursday Mornings but she is unable to do so  due to work schedule. Will have blue team call to see if patient has any other times in the week that may work and I can work with Dr. Gerilyn Pilgrim to see if we can fit her in. Patient states has no time for exercise given her kids and work schedule.   Systolic murmur 2/6 with benign features. Patient did state she has a history of heavy bleeding but last hgb was 13.3. Will assess for anemia with CBC. Patient asymptomatic (does have trace edema) so will plan to follow for now and reassess at next visit.     Orders Placed This Encounter  Procedures  . CBC    Standing Status: Future     Number of Occurrences:      Standing Expiration Date: 01/14/2015  . Comprehensive metabolic panel    Standing Status: Future     Number of Occurrences:      Standing Expiration Date: 01/14/2015  . Lipid panel    Standing Status: Future     Number of Occurrences:      Standing Expiration Date: 01/14/2015    Meds ordered this encounter  Medications  . hydrochlorothiazide (HYDRODIURIL) 25 MG tablet    Sig: Take 1 tablet (25 mg total) by mouth daily.    Dispense:  90 tablet    Refill:  3  . metoprolol succinate (TOPROL-XL) 25 MG 24 hr tablet    Sig: TAKE 1 TABLET (25 MG TOTAL) BY MOUTH DAILY.    Dispense:  90 tablet    Refill:  3   Health  Maintenance Due  Topic Date Due  . Tetanus/tdap -deferred until next visit  08/08/1989  . Influenza Vaccine -deferred until next visit  07/25/2013  Pap completed today-hopeful for satisfactory specimen

## 2014-01-15 NOTE — Assessment & Plan Note (Signed)
2/6 with benign features. Patient did state she has a history of heavy bleeding but last hgb was 13.3. Will assess for anemia with CBC. Patient asymptomatic (does have trace edema) so will plan to follow for now and reassess at next visit.

## 2014-01-15 NOTE — Assessment & Plan Note (Signed)
Well controlled. Continue current meds: toprol XL 25mg  and HCTZ 25mg . Asked patient to return for fasting labwork.

## 2014-01-16 ENCOUNTER — Telehealth: Payer: Self-pay | Admitting: *Deleted

## 2014-01-16 NOTE — Telephone Encounter (Signed)
Message copied by Osborne OmanFLEEGER, JESSICA D on Fri Jan 16, 2014  9:37 AM ------      Message from: Shelva MajesticHUNTER, STEPHEN O      Created: Thu Jan 15, 2014  1:17 PM       Will have blue team call to see if patient has any other times in the week that may work and I can work with Dr. Gerilyn PilgrimSykes to see if we can fit her in. ------

## 2014-01-16 NOTE — Telephone Encounter (Signed)
LMOVM.  Please ask what days/times are best for her to come see Dr. Durene CalHunter and Dr. Gerilyn PilgrimSykes? Rayjon Wery, Maryjo RochesterJessica Dawn

## 2014-01-19 ENCOUNTER — Encounter: Payer: Self-pay | Admitting: Family Medicine

## 2014-05-13 ENCOUNTER — Ambulatory Visit (INDEPENDENT_AMBULATORY_CARE_PROVIDER_SITE_OTHER): Payer: Self-pay | Admitting: Family Medicine

## 2014-05-13 VITALS — BP 124/80 | HR 79 | Temp 99.7°F | Resp 20 | Wt 224.0 lb

## 2014-05-13 DIAGNOSIS — J309 Allergic rhinitis, unspecified: Secondary | ICD-10-CM

## 2014-05-13 MED ORDER — CETIRIZINE HCL 10 MG PO TABS: 10.0000 mg | ORAL_TABLET | Freq: Every day | ORAL | Status: DC

## 2014-05-13 MED ORDER — FLUTICASONE PROPIONATE 50 MCG/ACT NA SUSP: 2.0000 | Freq: Every day | NASAL | Status: DC

## 2014-05-13 MED ORDER — BENZONATATE 100 MG PO CAPS: 100.0000 mg | ORAL_CAPSULE | Freq: Three times a day (TID) | ORAL | Status: DC | PRN

## 2014-05-13 NOTE — Patient Instructions (Signed)
It has been a pleasure to see you today. Please take the medications as prescribed. Make your next appointment to f/u as needed.

## 2014-05-13 NOTE — Progress Notes (Signed)
Family Medicine Office Visit Note   Subjective:   Patient ID: Rayetta Piggatricia A Yahaya, female  DOB: 1971/12/19, 43 y.o.. MRN: [id number]   Pt that comes today for same day appointment complaining of allergies. She rep[orts rhinorrhea and congestion for about a month. Worse when she is outdoors. She has not been taking any medication for this and for the past two days she has been feeling worse. She reports chills and subjective fever; also mild dry cough. No sputum production, SOB, chest pain or other symptoms. Denies HA, rashes, joint involvement or general malaise. Has no history of sick contact or travel.   Review of Systems:  Per HPI, also denies diarrhea, nausea or vomiting.   Objective:   Physical Exam: Gen:  NAD HEENT:  Head: normal  Mouth/nose:Moist mucous membranes. Mild to moderate nasal congestion. Clear rhinorrhea. Erythematous oropharynx, no exudates. No tenderness on maxillary or fontal sinuses.  Eyes: Sclera white, no erythema.  Neck: supple, no adenopathies.  Ears: normal TM bilaterally, no erythema no bulging. CV: Regular rate and rhythm, no murmurs rubs or gallops PULM: Clear to auscultation bilaterally. No wheezes/rales/rhonchi EXT: No edema Neuro: Alert and oriented x3. No focalization  Assessment & Plan:

## 2014-05-14 DIAGNOSIS — J309 Allergic rhinitis, unspecified: Secondary | ICD-10-CM | POA: Insufficient documentation

## 2014-05-14 NOTE — Assessment & Plan Note (Signed)
Complicated most likely with a recent viral URI.  Symptomatic treatment and start Flonase as well as zyrtec  Discussed signs of worsening condition that should prompt re-evaluation.

## 2015-01-29 ENCOUNTER — Other Ambulatory Visit: Payer: Self-pay | Admitting: *Deleted

## 2015-01-29 DIAGNOSIS — I10 Essential (primary) hypertension: Secondary | ICD-10-CM

## 2015-01-29 MED ORDER — METOPROLOL SUCCINATE ER 25 MG PO TB24: ORAL_TABLET | ORAL | Status: DC

## 2015-01-29 MED ORDER — HYDROCHLOROTHIAZIDE 25 MG PO TABS: 25.0000 mg | ORAL_TABLET | Freq: Every day | ORAL | Status: DC

## 2015-01-29 NOTE — Telephone Encounter (Signed)
Patient hasn't been seen in 1 year. I will refill her BP meds for this month but see needs to be seen for blood work and HTN visit before more refills given. Currently no PCP on file

## 2015-02-28 ENCOUNTER — Other Ambulatory Visit: Payer: Self-pay | Admitting: Family Medicine

## 2015-02-28 DIAGNOSIS — I10 Essential (primary) hypertension: Secondary | ICD-10-CM

## 2015-03-02 NOTE — Telephone Encounter (Signed)
Please call. Pt needs doctor apt prior to additional refills being given.

## 2015-03-02 NOTE — Telephone Encounter (Signed)
Letter mailed. Zamara Cozad,CMA  

## 2015-04-01 ENCOUNTER — Other Ambulatory Visit: Payer: Self-pay | Admitting: Family Medicine

## 2015-04-01 ENCOUNTER — Encounter: Payer: Self-pay | Admitting: *Deleted

## 2015-05-25 ENCOUNTER — Ambulatory Visit (INDEPENDENT_AMBULATORY_CARE_PROVIDER_SITE_OTHER): Payer: 59 | Admitting: Family Medicine

## 2015-05-25 ENCOUNTER — Encounter: Payer: Self-pay | Admitting: Family Medicine

## 2015-05-25 VITALS — BP 142/90 | HR 61 | Temp 98.9°F | Ht 65.0 in | Wt 228.0 lb

## 2015-05-25 DIAGNOSIS — B169 Acute hepatitis B without delta-agent and without hepatic coma: Secondary | ICD-10-CM

## 2015-05-25 DIAGNOSIS — B1910 Unspecified viral hepatitis B without hepatic coma: Secondary | ICD-10-CM

## 2015-05-25 DIAGNOSIS — K759 Inflammatory liver disease, unspecified: Secondary | ICD-10-CM

## 2015-05-25 DIAGNOSIS — I10 Essential (primary) hypertension: Secondary | ICD-10-CM

## 2015-05-25 LAB — CBC
HCT: 32.3 % — ABNORMAL LOW (ref 36.0–46.0)
HEMOGLOBIN: 10.2 g/dL — AB (ref 12.0–15.0)
MCH: 24.2 pg — ABNORMAL LOW (ref 26.0–34.0)
MCHC: 31.6 g/dL (ref 30.0–36.0)
MCV: 76.5 fL — ABNORMAL LOW (ref 78.0–100.0)
MPV: 8.6 fL (ref 8.6–12.4)
Platelets: 239 10*3/uL (ref 150–400)
RBC: 4.22 MIL/uL (ref 3.87–5.11)
RDW: 17.1 % — ABNORMAL HIGH (ref 11.5–15.5)
WBC: 6.1 10*3/uL (ref 4.0–10.5)

## 2015-05-25 LAB — LIPID PANEL
CHOL/HDL RATIO: 3.3 ratio
CHOLESTEROL: 117 mg/dL (ref 0–200)
HDL: 36 mg/dL — AB (ref >=46)
LDL Cholesterol: 61 mg/dL (ref 0–99)
TRIGLYCERIDES: 102 mg/dL (ref <150)
VLDL: 20 mg/dL (ref 0–40)

## 2015-05-25 LAB — COMPREHENSIVE METABOLIC PANEL
ALT: 14 U/L (ref 0–35)
AST: 15 U/L (ref 0–37)
Albumin: 3.8 g/dL (ref 3.5–5.2)
Alkaline Phosphatase: 55 U/L (ref 39–117)
BILIRUBIN TOTAL: 0.2 mg/dL (ref 0.2–1.2)
BUN: 12 mg/dL (ref 6–23)
CALCIUM: 8.7 mg/dL (ref 8.4–10.5)
CO2: 26 meq/L (ref 19–32)
CREATININE: 0.8 mg/dL (ref 0.50–1.10)
Chloride: 106 mEq/L (ref 96–112)
Glucose, Bld: 75 mg/dL (ref 70–99)
Potassium: 3.8 mEq/L (ref 3.5–5.3)
SODIUM: 139 meq/L (ref 135–145)
Total Protein: 6.7 g/dL (ref 6.0–8.3)

## 2015-05-25 MED ORDER — METOPROLOL SUCCINATE ER 25 MG PO TB24: 25.0000 mg | ORAL_TABLET | Freq: Every day | ORAL | Status: DC

## 2015-05-25 MED ORDER — HYDROCHLOROTHIAZIDE 25 MG PO TABS: 25.0000 mg | ORAL_TABLET | Freq: Every day | ORAL | Status: DC

## 2015-05-25 NOTE — Patient Instructions (Signed)
It was a pleasure meeting you today!  I will let you know the results of the lab work. I have sent in refills for you Metoprolol and HCTZ. Please try compression stockings for the swelling in your legs. If you develop chest pain or shortness of breath, please go to the Emergency Room.  Thanks again! Dr. Caroleen Hammanumley

## 2015-05-26 LAB — HEPATITIS C ANTIBODY: HCV Ab: NEGATIVE

## 2015-05-26 LAB — HEPATITIS B SURFACE ANTIGEN: HEP B S AG: POSITIVE — AB

## 2015-05-26 LAB — HEPATITIS B CORE ANTIBODY, IGM: HEP B C IGM: NONREACTIVE

## 2015-05-26 LAB — HEPATITIS B SURFACE ANTIBODY, QUANTITATIVE: Hepatitis B-Post: 0 m[IU]/mL

## 2015-05-26 LAB — HEPATITIS B SURF AG CONFIRMATION: Hepatitis B Surf Ag Confirmation: POSITIVE — AB

## 2015-05-27 DIAGNOSIS — B181 Chronic viral hepatitis B without delta-agent: Secondary | ICD-10-CM | POA: Insufficient documentation

## 2015-05-27 NOTE — Assessment & Plan Note (Signed)
-   No record in chart, but patient reports possible history of hepatitis - Check hepatitis labs to verify diagnosis - Check liver enzymes

## 2015-05-27 NOTE — Progress Notes (Signed)
Subjective:     Patient ID: Stephanie Dorsey, female   DOB: 1971-10-11, 44 y.o.   MRN: [id number]  HPI Stephanie Dorsey is a 44yo female presenting today to establish care. Initially from [place] and primary language is [place], however she has been in [place] since she was a teenager and speaks [place] well.  - States she used to follow at [facility] before she went to visit another clinic and she was told she would have to re-establish care before she could get refills on her medications. Last office visit noted to be in 04/2014 with Dr. Aviva SignsPiloto - Notes leg swelling x1 week. Worse at end of day and improved with elevation of legs. Denies CP, SOB. Denies orthopnea, PND.   - PMH: Hypertension, States she believes she has some form of hepatitis   - Unsure what form of hepatitis, how it was diagnosed, when/where/if it was treated, whether she still has it.  - Two vaginal deliveries, at term - Sx: None - Meds:  - Metoprolol 25mg  daily (has not been taking [id number])  - HCTZ 25mg  daily (has not been taking x1 month) - Ax: None - Fam: None - Soc: Lives with two daughters, ages 811 and 5014. Owns a car. Exercises regularly (walking). Denies history of smoking, illicit drug use, and alcohol use. Not sexually active, but states she has only had female sexual partners in past. States she received some education in [place] which she believes correlates to some high school in [place], however she is unsure which grade is equivalent since they are measured differently. - Maintenance: Not due for Mammogram or Colonoscopy. Pap Smear in 12/2013.  Review of Systems  Respiratory: Negative for shortness of breath.   Cardiovascular: Positive for leg swelling. Negative for chest pain.       Objective:   Physical Exam  Constitutional: She is oriented to person, place, and time. She appears well-nourished. No distress.  HENT:  Head: Normocephalic and atraumatic.  Right Ear: External ear normal.  Mouth/Throat: Oropharynx is clear and  moist.  Eyes: Pupils are equal, round, and reactive to light. Right eye exhibits no discharge. Left eye exhibits no discharge.  Neck: Normal range of motion. Neck supple. No thyromegaly present.  Cardiovascular: Normal rate and regular rhythm.  Exam reveals no gallop and no friction rub.   No murmur heard. Pulmonary/Chest: Effort normal. No respiratory distress. She has no wheezes. She has no rales.  Abdominal: Soft. She exhibits no distension and no mass. There is no tenderness. There is no rebound.  Musculoskeletal: She exhibits edema.  Lymphadenopathy:    She has no cervical adenopathy.  Neurological: She is alert and oriented to person, place, and time. No cranial nerve deficit.  Skin: Skin is warm and dry. No rash noted. No erythema.  Psychiatric: She has a normal mood and affect.      Assessment:     Please refer to Problem List for Assessment.    Plan:     Please refer to Problem List for Plan.

## 2015-05-27 NOTE — Assessment & Plan Note (Addendum)
-   BP 155/96 >> 142/90 - Has been out of medication x1 month - Will restart Metoprolol 25mg  and HCTZ 25mg  - Obtain CBC, CMP, and TSH

## 2015-06-01 ENCOUNTER — Encounter: Payer: Self-pay | Admitting: Family Medicine

## 2015-06-17 ENCOUNTER — Encounter: Payer: Self-pay | Admitting: Family Medicine

## 2015-06-17 DIAGNOSIS — B181 Chronic viral hepatitis B without delta-agent: Secondary | ICD-10-CM

## 2015-07-13 ENCOUNTER — Ambulatory Visit: Payer: 59 | Admitting: Internal Medicine

## 2015-07-20 ENCOUNTER — Ambulatory Visit: Payer: 59 | Admitting: Internal Medicine

## 2015-08-19 ENCOUNTER — Other Ambulatory Visit: Payer: Self-pay | Admitting: Family Medicine

## 2015-09-18 ENCOUNTER — Other Ambulatory Visit: Payer: Self-pay | Admitting: Family Medicine

## 2016-01-27 ENCOUNTER — Other Ambulatory Visit: Payer: Self-pay | Admitting: Family Medicine

## 2016-05-31 ENCOUNTER — Ambulatory Visit (INDEPENDENT_AMBULATORY_CARE_PROVIDER_SITE_OTHER): Payer: BLUE CROSS/BLUE SHIELD | Admitting: Family Medicine

## 2016-05-31 ENCOUNTER — Encounter: Payer: Self-pay | Admitting: Family Medicine

## 2016-05-31 VITALS — BP 138/86 | HR 56 | Temp 98.5°F | Ht 64.5 in | Wt 230.0 lb

## 2016-05-31 DIAGNOSIS — B181 Chronic viral hepatitis B without delta-agent: Secondary | ICD-10-CM

## 2016-05-31 DIAGNOSIS — Z0000 Encounter for general adult medical examination without abnormal findings: Secondary | ICD-10-CM

## 2016-05-31 DIAGNOSIS — I10 Essential (primary) hypertension: Secondary | ICD-10-CM | POA: Diagnosis not present

## 2016-05-31 NOTE — Patient Instructions (Signed)
Thank you so much for coming to visit today! Your blood pressure is right at your goal of less than 140/90. Please check your blood pressure once daily after resting for at least 5 minutes. Please let me know if they become consistently above your goal so we can adjust your blood pressure medicines.  I have placed another referral to ID.  Please follow up in 6 months to follow up your hypertension.  Dr. Caroleen Hammanumley

## 2016-06-01 DIAGNOSIS — Z0000 Encounter for general adult medical examination without abnormal findings: Secondary | ICD-10-CM | POA: Insufficient documentation

## 2016-06-01 NOTE — Assessment & Plan Note (Signed)
-   Up to date on Pap Smear. Not yet due for Colonoscopy or Mammogram.

## 2016-06-01 NOTE — Assessment & Plan Note (Signed)
Referral to ID placed

## 2016-06-01 NOTE — Progress Notes (Signed)
Subjective:     Patient ID: Stephanie PiggPatricia A Dorsey, female   DOB: [birth year], 45 y.o.   MRN: [id number]  HPI Stephanie Dorsey is here for health maintenance and for follow up of hypertension. - Denies any acute concerns - Denies headache, blurred vision, shortness of breath, chest pain - Does not check blood pressure often. States she has a cuff but it needs new batteries. - Currently prescribed HCTZ 25mg  and Metoprolol 25mg . Reports compliance - Last pap smear in 12/2013 - History of chronic hepatitis B. Denies symptoms. Requests referral to ID. - Not yet due for mammogram or colonoscopy  Review of Systems Per HPI. Other systems negative.    Objective:   Physical Exam  Constitutional: She is oriented to person, place, and time. She appears well-developed and well-nourished. No distress.  HENT:  Head: Normocephalic and atraumatic.  Cardiovascular: Normal rate and regular rhythm.  Exam reveals no gallop and no friction rub.   No murmur heard. Pulmonary/Chest: Effort normal. No respiratory distress. She has no wheezes.  Musculoskeletal: She exhibits no edema.  Neurological: She is alert and oriented to person, place, and time.  Psychiatric: She has a normal mood and affect. Stephanie Dorsey behavior is normal.      Assessment and Plan:     HTN (hypertension) - Initial BP 147/79, improved to 138/86 on recheck - Goal <140/90. - To check BP once daily. To call office if consistently above goal. Bring log to next office visit. - Continue HCTZ and Metoprolol - Follow up in 6 months  Hepatitis B - Referral to ID placed.  Health care maintenance - Up to date on Pap Smear. Not yet due for Colonoscopy or Mammogram.

## 2016-06-01 NOTE — Assessment & Plan Note (Signed)
-   Initial BP 147/79, improved to 138/86 on recheck - Goal <140/90. - To check BP once daily. To call office if consistently above goal. Bring log to next office visit. - Continue HCTZ and Metoprolol - Follow up in 6 months

## 2016-06-15 ENCOUNTER — Other Ambulatory Visit: Payer: Self-pay | Admitting: Family Medicine

## 2016-07-26 ENCOUNTER — Encounter: Payer: Self-pay | Admitting: Internal Medicine

## 2016-07-26 ENCOUNTER — Ambulatory Visit (INDEPENDENT_AMBULATORY_CARE_PROVIDER_SITE_OTHER): Payer: BLUE CROSS/BLUE SHIELD | Admitting: Internal Medicine

## 2016-07-26 DIAGNOSIS — B181 Chronic viral hepatitis B without delta-agent: Secondary | ICD-10-CM | POA: Diagnosis not present

## 2016-07-26 LAB — PROTIME-INR
INR: 1
PROTHROMBIN TIME: 10.6 s (ref 9.0–11.5)

## 2016-07-26 NOTE — Progress Notes (Signed)
[facility]  Reason for Consult: Chronic Hepatitis B Referring Physician: Dr. Garry Heater  Patient Active Problem List   Diagnosis Date Noted  . Chronic hepatitis B (HCC) 05/27/2015    Priority: High  . Health care maintenance 06/01/2016  . Allergic rhinitis 05/14/2014  . Systolic murmur 01/15/2014  . Family planning counseling 02/06/2013  . HTN (hypertension) 02/06/2013  . Obesity (BMI 30-39.9) 02/06/2013    Patient's Medications  New Prescriptions   No medications on file  Previous Medications   HYDROCHLOROTHIAZIDE (HYDRODIURIL) 25 MG TABLET    TAKE ONE TABLET BY MOUTH EVERY DAY   METOPROLOL SUCCINATE (TOPROL-XL) 25 MG 24 HR TABLET    TAKE ONE TABLET BY MOUTH EVERY DAY   MULTIPLE VITAMIN (MULTIVITAMIN WITH MINERALS) TABS    Take 1 tablet by mouth daily.  Modified Medications   No medications on file  Discontinued Medications   No medications on file    Recommendations: 1. Check blood work today including complete metabolic panel, hepatitis B e antigen, e antibody, hepatitis B DNA viral load, hepatitis D antibody' INR, and alpha-fetoprotein 2. Hepatic ultrasound with elastography 3. Follow-up in 1-2 weeks  Assessment: She has chronic hepatitis B. She had normal liver enzymes one year ago. I will need to complete a staging workup to determine if she is a candidate for hepatitis B therapy. I will check lab work today and order ultrasound with elastography and see her back in 1-2 weeks. I have recommended that she ask her husband to be tested for hepatitis B.   HPI: Stephanie Dorsey is a 45 y.o. female who was found to be hepatitis B surface antigen positive 15 years ago during her first pregnancy. She does not recall ever being tested previously. She does not have any history of clinical hepatitis or yellow jaundice. She is not sure how she became infected. She states that her mother is hepatitis B negative. She has 4 lifetime sexual  contacts, all female. She does not know their status. She is currently married but separated. She has never had any surgery, transfusion, tattoos and has never injected drugs. She had her normal childhood immunizations but no other injection she recalls while she was living in [place]. Her 2 children ages 57 and 27 years old were given hepatitis B immunoglobulin and immunized and are hepatitis B negative. She only drinks alcohol on occasion and never to excess. She currently works as a Engineer, structural for [employer].  Review of Systems: Review of Systems  Constitutional: Negative for chills, diaphoresis, fever, malaise/fatigue and weight loss.  HENT: Negative for sore throat.   Respiratory: Negative for cough, sputum production and shortness of breath.   Cardiovascular: Negative for chest pain.  Gastrointestinal: Negative for abdominal pain, diarrhea, heartburn, nausea and vomiting.  Genitourinary: Negative for dysuria.  Musculoskeletal: Positive for joint pain. Negative for myalgias.  Skin: Negative for rash.       She has some intermittent problems with dry skin and on occasion feels like her skin is darker than normal  Neurological: Negative for dizziness and headaches.  Psychiatric/Behavioral: Negative for depression and substance abuse. The patient is not nervous/anxious.       Past Medical History:  Diagnosis Date  . Hepatitis B   . Hypertension     Social History  Substance Use Topics  . Smoking status: Never Smoker  . Smokeless tobacco: Never Used  . Alcohol use No  Family History  Problem Relation Age of Onset  . Arthritis Mother   . Hyperlipidemia Sister   . Arthritis Maternal Grandmother    Allergies  Allergen Reactions  . Penicillins Itching    Dizziness    OBJECTIVE: Vitals:   07/26/16 1349  BP: 124/85  Pulse: 71  Temp: 97.7 F (36.5 C)  TempSrc: Oral  SpO2: 99%  Weight: 235 lb (106.6 kg)   Body mass index is 39.71 kg/m.   Physical  Exam  Constitutional: She is oriented to person, place, and time.  She is very pleasant and in no distress. She is accompanied by her oldest daughter.  HENT:  Mouth/Throat: No oropharyngeal exudate.  No jaundice.  Eyes: Conjunctivae are normal.  Cardiovascular: Normal rate and regular rhythm.   No murmur heard. Pulmonary/Chest: Effort normal and breath sounds normal.  Abdominal: Soft. She exhibits no distension and no mass. There is no tenderness.  Musculoskeletal: Normal range of motion. She exhibits no edema or deformity.  Neurological: She is alert and oriented to person, place, and time.  Skin: No rash noted.  Psychiatric: Mood and affect normal.    Microbiology: No results found for this or any previous visit (from the past 240 hour(s)).  Cliffton Asters, MD [facility] [phone] pager   [phone] cell 07/26/2016, 3:25 PM

## 2016-07-26 NOTE — Assessment & Plan Note (Signed)
She has chronic hepatitis B. She had normal liver enzymes one year ago. I will need to complete a staging workup to determine if she is a candidate for hepatitis B therapy. I will check lab work today and order ultrasound with elastography and see her back in 1-2 weeks. I have recommended that she ask her husband to be tested for hepatitis B.

## 2016-07-27 LAB — COMPREHENSIVE METABOLIC PANEL
ALBUMIN: 3.6 g/dL (ref 3.6–5.1)
ALT: 13 U/L (ref 6–29)
AST: 13 U/L (ref 10–35)
Alkaline Phosphatase: 58 U/L (ref 33–115)
BILIRUBIN TOTAL: 0.2 mg/dL (ref 0.2–1.2)
BUN: 10 mg/dL (ref 7–25)
CALCIUM: 8.4 mg/dL — AB (ref 8.6–10.2)
CHLORIDE: 106 mmol/L (ref 98–110)
CO2: 27 mmol/L (ref 20–31)
Creat: 1 mg/dL (ref 0.50–1.10)
Glucose, Bld: 78 mg/dL (ref 65–99)
Potassium: 3.6 mmol/L (ref 3.5–5.3)
Sodium: 141 mmol/L (ref 135–146)
TOTAL PROTEIN: 6.8 g/dL (ref 6.1–8.1)

## 2016-07-27 LAB — AFP TUMOR MARKER: AFP TUMOR MARKER: 1.7 ng/mL (ref <6.1)

## 2016-07-28 LAB — HEPATITIS B E ANTIGEN: Hepatitis Be Antigen: NONREACTIVE

## 2016-07-28 LAB — HEPATITIS B DNA, ULTRAQUANTITATIVE, PCR
HEPATITIS B DNA: 836 [IU]/mL — AB (ref <20)
Hepatitis B DNA (Calc): 2.92 Log IU/mL — ABNORMAL HIGH (ref <1.30)

## 2016-08-01 LAB — HEPATITIS DELTA ANTIBODY: HEPATITIS D ANTIBODY TOTAL: NEGATIVE

## 2016-08-02 ENCOUNTER — Ambulatory Visit (INDEPENDENT_AMBULATORY_CARE_PROVIDER_SITE_OTHER): Payer: BLUE CROSS/BLUE SHIELD | Admitting: Internal Medicine

## 2016-08-02 ENCOUNTER — Encounter: Payer: Self-pay | Admitting: Internal Medicine

## 2016-08-02 ENCOUNTER — Telehealth: Payer: Self-pay | Admitting: *Deleted

## 2016-08-02 DIAGNOSIS — B181 Chronic viral hepatitis B without delta-agent: Secondary | ICD-10-CM

## 2016-08-02 NOTE — Telephone Encounter (Signed)
Called the patient to advise her of the appt made for her US and had to leave a message that it is set for 09/20/16 at 9 am at [facility] department. She can have nothing to eat or drink after midnight.

## 2016-08-02 NOTE — Progress Notes (Signed)
[facility]  Patient Active Problem List   Diagnosis Date Noted  . Chronic hepatitis B (HCC) 05/27/2015    Priority: High  . Health care maintenance 06/01/2016  . Allergic rhinitis 05/14/2014  . Systolic murmur 01/15/2014  . Family planning counseling 02/06/2013  . HTN (hypertension) 02/06/2013  . Obesity (BMI 30-39.9) 02/06/2013    Patient's Medications  New Prescriptions   No medications on file  Previous Medications   HYDROCHLOROTHIAZIDE (HYDRODIURIL) 25 MG TABLET    TAKE ONE TABLET BY MOUTH EVERY DAY   METOPROLOL SUCCINATE (TOPROL-XL) 25 MG 24 HR TABLET    TAKE ONE TABLET BY MOUTH EVERY DAY   MULTIPLE VITAMIN (MULTIVITAMIN WITH MINERALS) TABS    Take 1 tablet by mouth daily.  Modified Medications   No medications on file  Discontinued Medications   No medications on file    Subjective: [name] Is in for her one-week followup visit to review her hepatitis B lab results. She continues to feel well.  Review of Systems: Review of Systems  Constitutional: Negative for chills, diaphoresis, fever, malaise/fatigue and weight loss.  HENT: Negative for sore throat.   Respiratory: Negative for cough, sputum production and shortness of breath.   Cardiovascular: Negative for chest pain.  Gastrointestinal: Negative for diarrhea, nausea and vomiting.  Genitourinary: Negative for dysuria and frequency.  Musculoskeletal: Negative for joint pain and myalgias.  Skin: Negative for rash.  Neurological: Negative for dizziness and headaches.  Psychiatric/Behavioral: Negative for depression and substance abuse. The patient is not nervous/anxious.     Past Medical History:  Diagnosis Date  . Hepatitis B   . Hypertension     Social History  Substance Use Topics  . Smoking status: Never Smoker  . Smokeless tobacco: Never Used  . Alcohol use No    Family History  Problem Relation Age of Onset  . Arthritis Mother   . Hyperlipidemia Sister    . Arthritis Maternal Grandmother     Allergies  Allergen Reactions  . Penicillins Itching    Dizziness    Objective: Vitals:   08/02/16 1612  BP: 125/86  Pulse: 65  Temp: 97.9 F (36.6 C)  TempSrc: Oral  Weight: 232 lb (105.2 kg)  Height: 5\' 5"  (1.651 m)   Body mass index is 38.61 kg/m.  Physical Exam  Constitutional: She is oriented to person, place, and time.  HENT:  Mouth/Throat: No oropharyngeal exudate.  Eyes: Conjunctivae are normal.  Cardiovascular: Normal rate and regular rhythm.   No murmur heard. Pulmonary/Chest: Breath sounds normal.  Abdominal: Soft. She exhibits no mass. There is no tenderness.  Musculoskeletal: Normal range of motion.  Neurological: She is alert and oriented to person, place, and time.  Skin: No rash noted.  Psychiatric: Mood and affect normal.    Lab Results CMP     Component Value Date/Time   NA 141 07/26/2016 1448   K 3.6 07/26/2016 1448   CL 106 07/26/2016 1448   CO2 27 07/26/2016 1448   GLUCOSE 78 07/26/2016 1448   BUN 10 07/26/2016 1448   CREATININE 1.00 07/26/2016 1448   CALCIUM 8.4 (L) 07/26/2016 1448   PROT 6.8 07/26/2016 1448   ALBUMIN 3.6 07/26/2016 1448   AST 13 07/26/2016 1448   ALT 13 07/26/2016 1448   ALKPHOS 58 07/26/2016 1448   BILITOT 0.2 07/26/2016 1448   Hepatitis B e antigen negative Hepatitis B e antibody not reported yet Hepatitis B DNA  viral load 836 international units/mL Hepatitis D antibody negative Alpha-fetoprotein normal at 1.7 INR normal at 1.0   Problem List Items Addressed This Visit      High   Chronic hepatitis B (HCC)    Stephanie Dorsey has e antigen negative chronic hepatitis B. She has no clinical evidence of cirrhosis. We will be scheduling her to get an ultrasound will elastography. She has normal liver enzymes and a relatively low hepatitis B DNA viral load and does not need to start treatment at this time. She will follow-up after repeat lab work in 6 months.      Relevant  Orders   Hepatitis B DNA, ultraquantitative, PCR   Hepatitis B e antibody   Hepatitis B e antigen   Comprehensive metabolic panel    Other Visit Diagnoses   None.      Cliffton Asters, MD [facility] [phone] pager   [phone] cell 08/02/2016, 4:31 PM

## 2016-08-02 NOTE — Assessment & Plan Note (Signed)
Stephanie Dorsey Hashimotoatricia has e antigen negative chronic hepatitis B. She has no clinical evidence of cirrhosis. We will be scheduling her to get an ultrasound will elastography. She has normal liver enzymes and a relatively low hepatitis B DNA viral load and does not need to start treatment at this time. She will follow-up after repeat lab work in 6 months.

## 2016-09-20 ENCOUNTER — Ambulatory Visit (HOSPITAL_COMMUNITY): Payer: BLUE CROSS/BLUE SHIELD

## 2016-11-09 ENCOUNTER — Ambulatory Visit (HOSPITAL_COMMUNITY): Payer: BLUE CROSS/BLUE SHIELD

## 2016-12-08 ENCOUNTER — Other Ambulatory Visit: Payer: Self-pay | Admitting: Family Medicine

## 2017-03-29 ENCOUNTER — Ambulatory Visit (INDEPENDENT_AMBULATORY_CARE_PROVIDER_SITE_OTHER): Payer: BLUE CROSS/BLUE SHIELD | Admitting: Internal Medicine

## 2017-03-29 ENCOUNTER — Encounter: Payer: Self-pay | Admitting: Internal Medicine

## 2017-03-29 VITALS — BP 134/70 | HR 69 | Temp 97.7°F | Wt 229.2 lb

## 2017-03-29 DIAGNOSIS — R232 Flushing: Secondary | ICD-10-CM

## 2017-03-29 DIAGNOSIS — R231 Pallor: Secondary | ICD-10-CM

## 2017-03-29 DIAGNOSIS — R209 Unspecified disturbances of skin sensation: Secondary | ICD-10-CM

## 2017-03-29 NOTE — Patient Instructions (Addendum)
It was nice meeting you today Ms. Hetty Blend!  I will let you know if there are any abnormalities with your labwork.   You can try dabbing a little bit of peppermint oil behind your ears when you are hot.   If you have any questions or concerns, please feel free to call the clinic.   Be well,  Dr. Natale Milch

## 2017-03-29 NOTE — Progress Notes (Signed)
   Subjective:   Patient: Stephanie Dorsey       Birthdate: March 19, 1971       MRN: [id number]      HPI  Stephanie Dorsey is a 46 y.o. female presenting for same day clinic for hot flashes.   Hot flashes Present for the past couple weeks. Last for about one minute. Patient suddenly feels very warm and starts to sweat. Spontaneously resolves, then patient feels cold. Happens multiple times throughout the day. Also occurs at night and wakes her up. Denies palpitations, chest pains, SOB during occurrences. Has noted dry mouth recently so has been drinking a lot of water. Denies neck masses. LMP 2 weeks ago. Has had 3 lb weight loss since 07/2016. Denies changes in hair, denies dry skin. Generally has been feeling very well other than hot flashes.   Smoking status reviewed. Patient is never smoker.   Review of Systems See HPI.     Objective:  Physical Exam  Constitutional: She is oriented to person, place, and time and well-developed, well-nourished, and in no distress.  HENT:  Head: Normocephalic and atraumatic.  Nose: Nose normal.  Mouth/Throat: Oropharynx is clear and moist. No oropharyngeal exudate.  Eyes: Conjunctivae and EOM are normal. Right eye exhibits no discharge. Left eye exhibits no discharge.  Neck: Normal range of motion. Neck supple. No thyromegaly present.  No masses  Cardiovascular: Normal rate, regular rhythm and normal heart sounds.   No murmur heard. Pulmonary/Chest: Effort normal and breath sounds normal. No respiratory distress. She has no wheezes.  Lymphadenopathy:    She has no cervical adenopathy.  Neurological: She is alert and oriented to person, place, and time.  Skin: Skin is warm and dry.  Psychiatric: Affect and judgment normal.      Assessment & Plan:  Hot flashes Etiology uncertain. Patient only 46 yo and LMP two weeks ago, so doubt menopause as cause. Possibly thyroid-related, though no history of thyroid disorders and no neck masses noted. No other  symptoms suggestive or hyperthyroidism except dry throat. Less likely viral cause as no associated symptoms. Also possible environmental etiology, as temperature has been fluctuating drastically from day to day for past few weeks.  - TSH and CBC today - Will contact if any abnormalities - F/u if does not improve   Tarri Abernethy, MD, MPH PGY-2 [facility] Pager [phone]

## 2017-03-29 NOTE — Assessment & Plan Note (Signed)
Etiology uncertain. Patient only 46 yo and LMP two weeks ago, so doubt menopause as cause. Possibly thyroid-related, though no history of thyroid disorders and no neck masses noted. No other symptoms suggestive or hyperthyroidism except dry throat. Less likely viral cause as no associated symptoms. Also possible environmental etiology, as temperature has been fluctuating drastically from day to day for past few weeks.  - TSH and CBC today - Will contact if any abnormalities - F/u if does not improve

## 2017-03-30 LAB — CBC
HEMATOCRIT: 34.2 % (ref 34.0–46.6)
Hemoglobin: 11 g/dL — ABNORMAL LOW (ref 11.1–15.9)
MCH: 24.6 pg — ABNORMAL LOW (ref 26.6–33.0)
MCHC: 32.2 g/dL (ref 31.5–35.7)
MCV: 77 fL — ABNORMAL LOW (ref 79–97)
Platelets: 353 10*3/uL (ref 150–379)
RBC: 4.47 x10E6/uL (ref 3.77–5.28)
RDW: 16 % — AB (ref 12.3–15.4)
WBC: 5.3 10*3/uL (ref 3.4–10.8)

## 2017-03-30 LAB — TSH: TSH: 0.698 u[IU]/mL (ref 0.450–4.500)

## 2017-08-31 ENCOUNTER — Other Ambulatory Visit: Payer: Self-pay | Admitting: Student in an Organized Health Care Education/Training Program

## 2017-08-31 NOTE — Telephone Encounter (Signed)
Patient came  To office to request refill of:  Name of Medication(s):  metoprolol Last date of OV:   [facility]   Will route refill request to Clinic RN.  Discussed with patient policy to call pharmacy for future refills.  Also, discussed refills may take up to 48 hours to approve or deny. Patient only has 3 pills left.   Lina Sarheryl A Stanley

## 2017-09-04 MED ORDER — METOPROLOL SUCCINATE ER 25 MG PO TB24: 25.0000 mg | ORAL_TABLET | Freq: Every day | ORAL | 0 refills | Status: DC

## 2017-09-04 NOTE — Telephone Encounter (Signed)
2nd request.  Martin, Stephanie L, RN  

## 2017-09-19 ENCOUNTER — Encounter: Payer: Self-pay | Admitting: Student in an Organized Health Care Education/Training Program

## 2017-09-19 ENCOUNTER — Ambulatory Visit (INDEPENDENT_AMBULATORY_CARE_PROVIDER_SITE_OTHER): Payer: BLUE CROSS/BLUE SHIELD | Admitting: Student in an Organized Health Care Education/Training Program

## 2017-09-19 VITALS — BP 118/74 | HR 67 | Temp 98.5°F | Ht 65.0 in | Wt 233.6 lb

## 2017-09-19 DIAGNOSIS — E6609 Other obesity due to excess calories: Secondary | ICD-10-CM | POA: Diagnosis not present

## 2017-09-19 DIAGNOSIS — Z6841 Body Mass Index (BMI) 40.0 and over, adult: Secondary | ICD-10-CM | POA: Insufficient documentation

## 2017-09-19 DIAGNOSIS — I10 Essential (primary) hypertension: Secondary | ICD-10-CM | POA: Diagnosis not present

## 2017-09-19 DIAGNOSIS — Z6838 Body mass index (BMI) 38.0-38.9, adult: Secondary | ICD-10-CM | POA: Diagnosis not present

## 2017-09-19 MED ORDER — HYDROCHLOROTHIAZIDE 25 MG PO TABS: 25.0000 mg | ORAL_TABLET | Freq: Every day | ORAL | 1 refills | Status: DC

## 2017-09-19 MED ORDER — METOPROLOL SUCCINATE ER 25 MG PO TB24: 25.0000 mg | ORAL_TABLET | Freq: Every day | ORAL | 0 refills | Status: DC

## 2017-09-19 NOTE — Assessment & Plan Note (Signed)
-   Patient endorses weight gain despite efforts with diet and exercise - Open to medical nutrition therapy referral at today's visit - Referral placed, Dr. Gerilyn Pilgrim cart provided - Patient is not able to get in with Dr. Gerilyn Pilgrim, recommended that she makes an appointment for front office and we can talk about diet nutrition next visit.

## 2017-09-19 NOTE — Assessment & Plan Note (Addendum)
-   Blood pressure is well controlled today, no red flags - Plan to continue current regimen - Patient declines CMP at today's visit, would a reasonable to collect at next visit to check on electrolytes with HCTZ - Plan to follow-up in 3 months

## 2017-09-19 NOTE — Patient Instructions (Signed)
It was a pleasure seeing you today in our clinic. Today we discussed your blood pressure. Here is the treatment plan we have discussed and agreed upon together:  - Please continue taking your blood pressure medications - Your blood pressure was well-controlled today - A consult was placed to dietetics at today's visit.  Please call the number on the card attached to schedule this appointment. If you have any difficulty, call through our front office and schedule a follow up appointment with me so that we can talk more about nutrition and diet.  Our clinic's number is [phone]. Please call with questions or concerns about what we discussed today.  Be well, Dr. Mosetta Putt

## 2017-09-19 NOTE — Progress Notes (Signed)
CC: Hypertension follow-up  HPI: Stephanie Dorsey is a 46 y.o. female with PMH significant for hypertension and obesity who presents to [facility] today for hypertension follow-up.  Hypertension Disease Monitoring: Patient does not check her blood pressures at home Blood pressure range-she states this is the best blood pressure she seen in a while  Chest pain, palpitations- denies       Dyspnea- denies Medications: HCTZ 25 mg daily, Toprol 25 mg daily Compliance- 100% reported compliance  Lightheadedness,Syncope- denies    Edema- occasional, not today, endorses improvement with leg elevation  Obesity Patient endorses weight loss efforts with activity, using an op on her phone. She endorses being active for 30 minutes every day. She also endorses trying to eat healthier but states that she just keeps gaining weight. She is open to speaking with medical nutrition therapy or having another visit for diet and lifestyle -focused history and counseling.  Monitoring Labs and Parameters Last Bmet  Potassium  Date Value Ref Range Status  07/26/2016 3.6 3.5 - 5.3 mmol/L Final   Sodium  Date Value Ref Range Status  07/26/2016 141 135 - 146 mmol/L Final   Creat  Date Value Ref Range Status  07/26/2016 1.00 0.50 - 1.10 mg/dL Final      Last BPs:  BP Readings from Last 3 Encounters:  09/19/17 118/74  03/29/17 134/70  08/02/16 125/86   Review of Symptoms:  See HPI for ROS.   CC, SH/smoking status, and VS noted.  Objective: BP 118/74   Pulse 67   Temp 98.5 F (36.9 C) (Oral)   Ht  (1.651 m)   Wt 233 lb 9.6 oz (106 kg)   LMP 08/26/2017 (Exact Date)   SpO2 99%   BMI 38.87 kg/m  GEN: NAD, alert, cooperative, and pleasant. RESPIRATORY: clear to auscultation bilaterally with no wheezes, rhonchi or rales, good effort CV: RRR, no m/r/g, no peripheral edema SKIN: warm and dry, no rashes or lesions  Flu Vaccine: Offered, patient declines Tdap Vaccine: Offered, patient  declines  Assessment and plan:  HTN (hypertension) - Blood pressure is well controlled today, no red flags - Plan to continue current regimen - Patient declines CMP at today's visit, would a reasonable to collect at next visit to check on electrolytes with HCTZ - Plan to follow-up in 3 months  Obesity (BMI 30-39.9) - Patient endorses weight gain despite efforts with diet and exercise - Open to medical nutrition therapy referral at today's visit - Referral placed, Dr. Gerilyn Pilgrim cart provided - Patient is not able to get in with Dr. Gerilyn Pilgrim, recommended that she makes an appointment for front office and we can talk about diet nutrition next visit.   Orders Placed This Encounter  Procedures  . Amb ref to Medical Nutrition Therapy-MNT    Referral Priority:   Routine    Referral Type:   Consultation    Referral Reason:   Specialty Services Required    Requested Specialty:   Nutrition    Number of Visits Requested:   1    Meds ordered this encounter  Medications  . metoprolol succinate (TOPROL-XL) 25 MG 24 hr tablet    Sig: Take 1 tablet (25 mg total) by mouth daily.    Dispense:  90 tablet    Refill:  0    This prescription was filled on 06/15/2016. Any refills authorized will be placed on file.  . hydrochlorothiazide (HYDRODIURIL) 25 MG tablet    Sig: Take 1 tablet (25 mg  total) by mouth daily.    Dispense:  90 tablet    Refill:  1    This prescription was filled on 12/08/2016. Any refills authorized will be placed on file.     Howard Pouch, MD,MS,  PGY2 09/19/2017 10:24 AM

## 2017-10-04 ENCOUNTER — Encounter (HOSPITAL_COMMUNITY): Payer: Self-pay | Admitting: Emergency Medicine

## 2017-10-04 ENCOUNTER — Ambulatory Visit (HOSPITAL_COMMUNITY)
Admission: EM | Admit: 2017-10-04 | Discharge: 2017-10-04 | Disposition: A | Discharge status: Home / Self Care | Payer: Self-pay | Attending: Family Medicine | Admitting: Family Medicine

## 2017-10-04 ENCOUNTER — Ambulatory Visit (INDEPENDENT_AMBULATORY_CARE_PROVIDER_SITE_OTHER): Payer: Self-pay

## 2017-10-04 DIAGNOSIS — S8992XA Unspecified injury of left lower leg, initial encounter: Secondary | ICD-10-CM | POA: Diagnosis not present

## 2017-10-04 DIAGNOSIS — S8002XA Contusion of left knee, initial encounter: Secondary | ICD-10-CM

## 2017-10-04 MED ORDER — RABIES VACCINE, PCEC IM SUSR
INTRAMUSCULAR | Status: AC
  Filled 2017-10-04: qty 1
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
## 2017-10-04 NOTE — Discharge Instructions (Signed)
Apply ice to the knee off and on for the neck 2-3 days. He may walk on your knee but limit the amount of walking that she do for the next few days. The x-ray is negative. If he continued to have pain over the next 3-4 days without improving follow-up with your primary care doctor. If you are able to take such medicines may take ibuprofen every 6 hours as needed for pain.
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
## 2017-11-19 ENCOUNTER — Other Ambulatory Visit: Payer: Self-pay

## 2017-11-19 ENCOUNTER — Encounter: Payer: Self-pay | Admitting: Student in an Organized Health Care Education/Training Program

## 2017-11-19 ENCOUNTER — Ambulatory Visit: Payer: BLUE CROSS/BLUE SHIELD | Admitting: Student in an Organized Health Care Education/Training Program

## 2017-11-19 VITALS — BP 118/68 | HR 76 | Temp 98.5°F | Ht 65.0 in | Wt 236.0 lb

## 2017-11-19 DIAGNOSIS — M62838 Other muscle spasm: Secondary | ICD-10-CM | POA: Diagnosis not present

## 2017-11-19 DIAGNOSIS — J069 Acute upper respiratory infection, unspecified: Secondary | ICD-10-CM | POA: Diagnosis not present

## 2017-11-19 MED ORDER — CYCLOBENZAPRINE HCL 10 MG PO TABS: 10.0000 mg | ORAL_TABLET | Freq: Three times a day (TID) | ORAL | 0 refills | Status: DC | PRN

## 2017-11-19 NOTE — Assessment & Plan Note (Signed)
No suspicion for bacterial illness at this time. Patient is very well-appearing, does not have fevers. Main complaints are cough and sore throat. Recommend continuing supportive care, stay hydrated, over-the-counter remedies as needed. Return precautions were discussed

## 2017-11-19 NOTE — Progress Notes (Signed)
   CC: shoulder pain  HPI: Stephanie Dorsey is a 46 y.o. female with PMH significant for who presents to [facility] today with shoulder pain of several weeks duration.   Left shoulder pain Left shoulder pain has been going on for many months.  feels like tension, sometimes goes away when rotates arm. Has felt burning. Used to have muscle spasms, but out of medication. Feels tesnion in the arm, but no shooting pain, No numbness, no pins and needles.  Walks for exercise, no sudden onset or trauma.  Sore throat Sore throat for 2-3 days. No fevers. Congestion, cough. No rhinorrhea. No nausea, vomiting or diarrhea. No urinary symptoms.  Review of Symptoms:  See HPI for ROS.   CC, SH/smoking status, and VS noted.  Objective: BP 118/68   Pulse 76   Temp 98.5 F (36.9 C) (Oral)   Ht 5\' 5"  (1.651 m)   Wt 236 lb (107 kg)   LMP 09/24/2017 (Approximate)   SpO2 98%   BMI 39.27 kg/m  GEN: NAD, alert, cooperative, and pleasant. MSK: +right sided trapezius pain and tenseness. Full range of motion of the arm. No tenderness around the shoulder joint. No warmth, erythema, or skin lesions appreciated EYE: no conjunctival injection, pupils equally round and reactive to light ENMT: normal tympanic light reflex, no nasal polyps,no rhinorrhea, no pharyngeal erythema or exudates RESPIRATORY: clear to auscultation bilaterally with no wheezes, rhonchi or rales, good effort CV: RRR, no m/r/g, no peripheral edema GI: soft, non-tender, non-distended, no hepatosplenomegaly PSYCH: AAOx3, appropriate affect  Assessment and plan:  Muscle spasm of left shoulder History and exam are consistent with muscle spasm. Prescribed Flexeril 10 mg to be taken as needed for muscle spasms. Patient can use NSAIDs or Tylenol as needed for pain. Also encouraged her to stay hydrated. May take several weeks for this to improve and completely resolve.  Viral URI No suspicion for bacterial illness at this time. Patient is very  well-appearing, does not have fevers. Main complaints are cough and sore throat. Recommend continuing supportive care, stay hydrated, over-the-counter remedies as needed. Return precautions were discussed   No orders of the defined types were placed in this encounter.   Meds ordered this encounter  Medications  . cyclobenzaprine (FLEXERIL) 10 MG tablet    Sig: Take 1 tablet (10 mg total) by mouth 3 (three) times daily as needed for muscle spasms.    Dispense:  30 tablet    Refill:  0     Howard PouchLauren Thanos Cousineau, MD,MS,  PGY2 11/19/2017 4:52 PM

## 2017-11-19 NOTE — Assessment & Plan Note (Signed)
History and exam are consistent with muscle spasm. Prescribed Flexeril 10 mg to be taken as needed for muscle spasms. Patient can use NSAIDs or Tylenol as needed for pain. Also encouraged her to stay hydrated. May take several weeks for this to improve and completely resolve.

## 2017-11-19 NOTE — Patient Instructions (Signed)
It was a pleasure seeing you today in our clinic. Today we discussed your shoulder pain. Here is the treatment plan we have discussed and agreed upon together:  I sent a muscle relaxant called Flexeril to your pharmacy. It is important that you do not drive after taking this medication because it can cause drowsiness. It may take multiple weeks before your shoulder pain improves.  For your sore throat, please continue to stay hydrated. If you develop fevers lasting 3-5 days that are greater than 100.71F please come back to be seen.  Our clinic's number is [phone]. Please call with questions or concerns about what we discussed today.  Be well, Dr. Mosetta PuttFeng

## 2018-01-03 ENCOUNTER — Other Ambulatory Visit: Payer: Self-pay | Admitting: Student in an Organized Health Care Education/Training Program

## 2018-01-03 DIAGNOSIS — I10 Essential (primary) hypertension: Secondary | ICD-10-CM

## 2018-03-19 ENCOUNTER — Other Ambulatory Visit: Payer: Self-pay | Admitting: Student in an Organized Health Care Education/Training Program

## 2018-03-19 DIAGNOSIS — I10 Essential (primary) hypertension: Secondary | ICD-10-CM

## 2018-09-26 ENCOUNTER — Other Ambulatory Visit: Payer: Self-pay | Admitting: Student in an Organized Health Care Education/Training Program

## 2018-09-26 DIAGNOSIS — I10 Essential (primary) hypertension: Secondary | ICD-10-CM

## 2018-11-28 ENCOUNTER — Encounter: Payer: Self-pay | Admitting: Student in an Organized Health Care Education/Training Program

## 2018-11-28 ENCOUNTER — Other Ambulatory Visit: Payer: Self-pay

## 2018-11-28 ENCOUNTER — Ambulatory Visit (INDEPENDENT_AMBULATORY_CARE_PROVIDER_SITE_OTHER): Payer: BLUE CROSS/BLUE SHIELD | Admitting: Student in an Organized Health Care Education/Training Program

## 2018-11-28 VITALS — BP 118/74 | HR 62 | Temp 98.6°F | Ht 65.0 in | Wt 232.6 lb

## 2018-11-28 DIAGNOSIS — R222 Localized swelling, mass and lump, trunk: Secondary | ICD-10-CM

## 2018-11-28 DIAGNOSIS — Z0000 Encounter for general adult medical examination without abnormal findings: Secondary | ICD-10-CM

## 2018-11-28 NOTE — Progress Notes (Signed)
   CC: left supraclavicular fullness  HPI: Dorsey Brooklynatricia A Stephanie Dorsey is a 47 y.o. female  Who presents for left supraclavicular fullness.  Healthcare maintenance - Declines screening blood work - Feels she already got a TDAP recently when she was going to [place] and she will bring this paperwork to her next visit. - Declines screening HIV - Declines PAP but agrees to come back for this test - Declines flu shot. Counseled on risks of flu including hospitalization and death. She still declines.  Left supraclavicular fullness Patient feels there is asymmetry over her clavicle when she bears down. She denies pain in this area. She has not palpated a mass in this area. No dypsnea or chest pain. Otherwise asymptomatic.  Review of Symptoms:  See HPI for ROS.   CC, SH/smoking status, and VS noted.  Objective: BP 118/74   Pulse 62   Temp 98.6 F (37 C) (Oral)   Ht 5\' 5"  (1.651 m)   Wt 232 lb 9.6 oz (105.5 kg)   SpO2 99%   BMI 38.71 kg/m  GEN: NAD, alert, cooperative, and pleasant. RESPIRATORY: Comfortable work of breathing, speaks in full sentences CV: Regular rate noted, distal extremities well perfused and warm without edema GI: Soft, nondistended SKIN: warm and dry, no rashes or lesions NEURO: II-XII grossly intact MSK: Moves 4 extremities equally. No ttp over supraclavicular area where she appreciates fullness. No mass appreciated. No asymmetry appreciated.  PSYCH: AAOx3, appropriate affect   Assessment and plan: Left supraclavicular fullness - no palpable mass. No tenderness to palpation. This is not very distressing to her. - monitor, return to care if it changes or becomes bothersome  Healthcare maintenance - patient declines most healthcare maintenance items. She agrees to come back for PAP which is important because there was not satisfactory sample collected on her previous test. Would consider lipid panel, BMP, and screening HIV at that visit if she is agreeable.  [name],  MD,MS,  PGY3 11/28/2018 4:10 PM

## 2018-11-28 NOTE — Patient Instructions (Signed)
It was a pleasure seeing you today in our clinic.  Here is the treatment plan we have discussed and agreed upon together:  Please come back for your pap smear.  Our clinic's number is [phone]. Please call with questions or concerns about what we discussed today.  Be well, Dr. Mosetta PuttFeng

## 2019-01-09 ENCOUNTER — Other Ambulatory Visit (HOSPITAL_COMMUNITY)
Admission: RE | Admit: 2019-01-09 | Discharge: 2019-01-09 | Disposition: A | Discharge status: Home / Self Care | Payer: BLUE CROSS/BLUE SHIELD | Source: Ambulatory Visit | Attending: Family Medicine | Admitting: Family Medicine

## 2019-01-09 ENCOUNTER — Ambulatory Visit: Payer: BLUE CROSS/BLUE SHIELD | Admitting: Student in an Organized Health Care Education/Training Program

## 2019-01-09 VITALS — BP 108/80 | HR 69 | Temp 98.4°F | Ht 65.0 in | Wt 237.6 lb

## 2019-01-09 DIAGNOSIS — Z113 Encounter for screening for infections with a predominantly sexual mode of transmission: Secondary | ICD-10-CM

## 2019-01-09 DIAGNOSIS — Z124 Encounter for screening for malignant neoplasm of cervix: Secondary | ICD-10-CM | POA: Diagnosis not present

## 2019-01-09 LAB — POCT WET PREP (WET MOUNT)
Clue Cells Wet Prep Whiff POC: NEGATIVE
Trichomonas Wet Prep HPF POC: ABSENT

## 2019-01-09 NOTE — Progress Notes (Signed)
   CC: PAP  HPI: Stephanie Dorsey is a 48 y.o. female presenting for PAP.  She had pap smears in 2004 and 2015 which were negative for intraepithelial lesion or malignancy.  She denies vaginal discharge. No dysuria, urinary frequency or urgency. No N/V/D/C. She does not believe that she has been exposed to STI but would like STI co-testing today with her pap.    Review of Symptoms:  See HPI for ROS.   CC, SH/smoking status, and VS noted.  Objective: BP 108/80   Pulse 69   Temp 98.4 F (36.9 C) (Oral)   Ht 5\' 5"  (1.651 m)   Wt 237 lb 9.6 oz (107.8 kg)   SpO2 99%   BMI 39.54 kg/m  GEN: NAD, alert, cooperative, and pleasant. RESPIRATORY: Comfortable work of breathing, speaks in full sentences CV: Regular rate noted, distal extremities well perfused and warm without edema GI: Soft, nondistended SKIN: warm and dry, no rashes or lesions NEURO: II-XII grossly intact MSK: Moves 4 extremities equally PSYCH: AAOx3, appropriate affect GU: Normal external genitalia, multiparous cervix without apparent lesions. No discharge.  Assessment and plan:  Encounter for screening for cerivical cancer Pap and HPV co-testing GC chla Wet prep HIV and RPR Will follow up with results  Orders Placed This Encounter  Procedures  . HIV Antibody (routine testing w rflx)  . RPR  . POCT Wet Prep [facility])    No orders of the defined types were placed in this encounter.    Howard Pouch, MD,MS,  PGY3 01/10/2019 10:45 AM

## 2019-01-09 NOTE — Patient Instructions (Signed)
It was a pleasure seeing you today in our clinic. Here is the treatment plan we have discussed and agreed upon together:  We drew blood work at today's visit. I will call or send you a letter with these results. If you do not hear from me within the next week, please give our office a call.  Our clinic's number is [phone]. Please call with questions or concerns about what we discussed today.  Be well, Dr. Wafaa Deemer   

## 2019-01-10 LAB — RPR: RPR Ser Ql: NONREACTIVE

## 2019-01-10 LAB — HIV ANTIBODY (ROUTINE TESTING W REFLEX): HIV Screen 4th Generation wRfx: NONREACTIVE

## 2019-01-13 LAB — CERVICOVAGINAL ANCILLARY ONLY
Chlamydia: NEGATIVE
Neisseria Gonorrhea: NEGATIVE

## 2019-01-15 ENCOUNTER — Encounter: Payer: Self-pay | Admitting: Student in an Organized Health Care Education/Training Program

## 2019-01-15 LAB — CYTOLOGY - PAP
Diagnosis: NEGATIVE
HPV: NOT DETECTED

## 2019-02-17 ENCOUNTER — Other Ambulatory Visit: Payer: Self-pay | Admitting: Student in an Organized Health Care Education/Training Program

## 2019-02-17 DIAGNOSIS — I10 Essential (primary) hypertension: Secondary | ICD-10-CM

## 2019-09-17 ENCOUNTER — Other Ambulatory Visit: Payer: Self-pay | Admitting: Student in an Organized Health Care Education/Training Program

## 2019-09-17 DIAGNOSIS — I10 Essential (primary) hypertension: Secondary | ICD-10-CM

## 2019-11-11 ENCOUNTER — Other Ambulatory Visit: Payer: Self-pay | Admitting: Student in an Organized Health Care Education/Training Program

## 2019-11-11 DIAGNOSIS — I10 Essential (primary) hypertension: Secondary | ICD-10-CM

## 2020-01-19 ENCOUNTER — Ambulatory Visit (INDEPENDENT_AMBULATORY_CARE_PROVIDER_SITE_OTHER): Payer: 59 | Admitting: Family Medicine

## 2020-01-19 ENCOUNTER — Other Ambulatory Visit: Payer: Self-pay

## 2020-01-19 ENCOUNTER — Encounter: Payer: Self-pay | Admitting: Family Medicine

## 2020-01-19 VITALS — BP 104/72 | HR 98

## 2020-01-19 DIAGNOSIS — Z0000 Encounter for general adult medical examination without abnormal findings: Secondary | ICD-10-CM | POA: Diagnosis not present

## 2020-01-19 DIAGNOSIS — E6609 Other obesity due to excess calories: Secondary | ICD-10-CM | POA: Diagnosis not present

## 2020-01-19 DIAGNOSIS — B181 Chronic viral hepatitis B without delta-agent: Secondary | ICD-10-CM

## 2020-01-19 DIAGNOSIS — E66812 Obesity, class 2: Secondary | ICD-10-CM

## 2020-01-19 DIAGNOSIS — Z6838 Body mass index (BMI) 38.0-38.9, adult: Secondary | ICD-10-CM

## 2020-01-19 DIAGNOSIS — I10 Essential (primary) hypertension: Secondary | ICD-10-CM

## 2020-01-19 NOTE — Progress Notes (Signed)
   Subjective:    Stephanie Dorsey - 49 y.o. female MRN [id number]  Date of birth: 1971-07-09  CC:  Stephanie Dorsey is here for her annual physical.  HPI: No concerns today. Mammogram: none yet, interested in getting this once she turns 49 Pap: UTD Diet: varied (vegetables, fruit, beans, not much meat or bread) Exercise: walks throughout the day at work Alcohol use: none Smoking: none Hepatitis B: needs to f/u with ID clinic, has not seen them for 3 years Meds: HCTZ, metoprolol Health Maintenance: Declines flu vaccine Health Maintenance Due  Topic Date Due  . TETANUS/TDAP  08/08/1989  . INFLUENZA VACCINE  07/26/2019    -  reports that she has never smoked. She has never used smokeless tobacco. - Review of Systems: Per HPI. - Past Medical History: Patient Active Problem List   Diagnosis Date Noted  . Viral URI 11/19/2017  . Class 2 obesity due to excess calories without serious comorbidity with body mass index (BMI) of 38.0 to 38.9 in adult 09/19/2017  . Hot flashes 03/29/2017  . Health care maintenance 06/01/2016  . Chronic hepatitis B (HCC) 05/27/2015  . Allergic rhinitis 05/14/2014  . Systolic murmur 01/15/2014  . Family planning counseling 02/06/2013  . HTN (hypertension) 02/06/2013   - Medications: reviewed and updated   Objective:   Physical Exam BP 104/72   Pulse 98   SpO2 (!) 80%  Gen: NAD, alert, cooperative with exam, well-appearing, obese, pleasant HEENT: NCAT, PERRL, clear conjunctiva, oropharynx clear, supple neck CV: RRR, good S1/S2, 2/6 systolic murmur, no edema, capillary refill brisk  Resp: CTABL, no wheezes, non-labored Abd: SNTND, BS present, no guarding or organomegaly Skin: no rashes, normal turgor  Neuro: no gross deficits.  Psych: good insight, alert and oriented        Assessment & Plan:   HTN (hypertension) BP well controlled today.  Will obtain BMP.  Chronic hepatitis B (HCC) Last ID note request that she follow-up with  them in 6 months.  This was 3 years ago.  Although patient's chronic hepatitis B was well controlled per her last visit with ID, advised patient to follow-up with them as soon as possible.  Class 2 obesity due to excess calories without serious comorbidity with body mass index (BMI) of 38.0 to 38.9 in adult Congratulated patient on her healthy lifestyle behaviors and encouraged her to continue these and to make sure that she does not take drink any beverages with calories if possible.  Health care maintenance Counseled patient on the importance of getting a colonoscopy and mammogram.  We will plan to order these when she turns 49.  Will obtain lipid panel today.    Lezlie Octave, M.D. 01/20/2020, 7:16 AM PGY-3, [facility]

## 2020-01-19 NOTE — Patient Instructions (Signed)
It was nice meeting you today [name]!  Today we are checking your cholesterol and electrolytes.  I will let you know if any of these labs are abnormal.  Please continue doing a great job staying active and eating plenty of fruits and vegetables.  Please remember to call around your 49th birthday and we will schedule your mammogram and colonoscopy.  Please also follow-up with your ID clinic for a checkup.  If you have any questions or concerns, please feel free to call the clinic.   Be well,  Dr. Frances Furbish

## 2020-01-20 ENCOUNTER — Telehealth: Payer: Self-pay | Admitting: Family Medicine

## 2020-01-20 ENCOUNTER — Other Ambulatory Visit: Payer: Self-pay | Admitting: Family Medicine

## 2020-01-20 LAB — COMPREHENSIVE METABOLIC PANEL
ALT: 16 IU/L (ref 0–32)
AST: 15 IU/L (ref 0–40)
Albumin/Globulin Ratio: 1.8 (ref 1.2–2.2)
Albumin: 4.7 g/dL (ref 3.8–4.8)
Alkaline Phosphatase: 62 IU/L (ref 39–117)
BUN/Creatinine Ratio: 17 (ref 9–23)
BUN: 15 mg/dL (ref 6–24)
Bilirubin Total: 0.3 mg/dL (ref 0.0–1.2)
CO2: 29 mmol/L (ref 20–29)
Calcium: 8.1 mg/dL — ABNORMAL LOW (ref 8.7–10.2)
Chloride: 100 mmol/L (ref 96–106)
Creatinine, Ser: 0.88 mg/dL (ref 0.57–1.00)
GFR calc Af Amer: 89 mL/min/{1.73_m2} (ref >59)
GFR calc non Af Amer: 77 mL/min/{1.73_m2} (ref >59)
Globulin, Total: 2.6 g/dL (ref 1.5–4.5)
Glucose: 72 mg/dL (ref 65–99)
Potassium: 4 mmol/L (ref 3.5–5.2)
Sodium: 142 mmol/L (ref 134–144)
Total Protein: 7.3 g/dL (ref 6.0–8.5)

## 2020-01-20 LAB — LIPID PANEL
Chol/HDL Ratio: 1.7 ratio (ref 0.0–4.4)
Cholesterol, Total: 159 mg/dL (ref 100–199)
HDL: 92 mg/dL (ref >39)
LDL Chol Calc (NIH): 56 mg/dL (ref 0–99)
Triglycerides: 54 mg/dL (ref 0–149)
VLDL Cholesterol Cal: 11 mg/dL (ref 5–40)

## 2020-01-20 MED ORDER — CALCIUM CARB-CHOLECALCIFEROL 500-400 MG-UNIT PO TABS: 2.0000 | ORAL_TABLET | Freq: Every day | ORAL | 11 refills | Status: AC

## 2020-01-20 NOTE — Assessment & Plan Note (Signed)
Counseled patient on the importance of getting a colonoscopy and mammogram.  We will plan to order these when she turns 49.  Will obtain lipid panel today.

## 2020-01-20 NOTE — Assessment & Plan Note (Signed)
Congratulated patient on her healthy lifestyle behaviors and encouraged her to continue these and to make sure that she does not take drink any beverages with calories if possible.

## 2020-01-20 NOTE — Assessment & Plan Note (Signed)
Last ID note request that she follow-up with them in 6 months.  This was 3 years ago.  Although patient's chronic hepatitis B was well controlled per her last visit with ID, advised patient to follow-up with them as soon as possible.

## 2020-01-20 NOTE — Telephone Encounter (Signed)
Left VM letting patient know that her cholesterol looks great, but her calcium is low.  I would like for her to take a calcium and vitamin D supplement, which I have sent to her pharmacy.  I would like for her to take 2 tablets of this daily which would give her 800 mg of calcium and 1000 units of cholecalciferol daily.  Asked her to call our clinic if she has any questions.

## 2020-01-20 NOTE — Assessment & Plan Note (Signed)
BP well controlled today.  Will obtain BMP.

## 2020-11-05 ENCOUNTER — Other Ambulatory Visit: Payer: Self-pay | Admitting: Family Medicine

## 2020-11-05 DIAGNOSIS — I10 Essential (primary) hypertension: Secondary | ICD-10-CM

## 2020-11-23 ENCOUNTER — Other Ambulatory Visit: Payer: Self-pay | Admitting: Family Medicine

## 2020-11-23 DIAGNOSIS — I10 Essential (primary) hypertension: Secondary | ICD-10-CM

## 2021-06-15 NOTE — Progress Notes (Signed)
SUBJECTIVE:   Chief compliant/HPI: annual examination  Stephanie Dorsey is a 50 y.o. who presents today for an annual exam.   HTN: BP today is at goal. Medications include HCTZ 25 mg qd, toprol 25 mg qd. She reports compliance with these medications.  Hep B: patient diagnosed with chronic hepatitis B in 2016. She was last seen at [facility] in 2017 with orders for RUQ [place] with elastography, no result in epic. Patient reports no jaundice, no RUQ [place]. Last LFTs in January 2021 WNL.   LMP about 1 year ago. No hot flashes. Suspect in menopause.  History tabs reviewed and updated.   Review of systems form reviewed and notable only for occasional foot pain. 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 She stands on her feet for long periods. She wears sandals, sometimes shoes with foam inserts. She has swelling at the end of the day.   OBJECTIVE:   BP 128/75   Pulse 82   Ht _0  (1.651 m)   Wt 244 lb 6.4 oz (110.9 kg)   LMP 06/16/2020   SpO2 96%   BMI 40.67 kg/m   Nursing note and vitals reviewed GEN: age appropriate AW, resting comfortably in chair, NAD, obese HEENT: NCAT. Sclera without injection or icterus. MMM.  Neck: Supple. No LAD. Cardiac: Regular rate and rhythm. Normal S1/S2. No murmurs, rubs, or gallops appreciated. 2+ radial pulses. Lungs: Clear bilaterally to ascultation. No increased WOB, no accessory muscle usage. No w/r/r. Abdomen: Normoactive bowel sounds. No tenderness to deep or light palpation. No rebound or guarding.  No HSM. Neuro: Alert and at baseline Ext: no edema, 2+ PD b/l, normal skin color/tone Psych: Pleasant and appropriate   ASSESSMENT/PLAN:   Chronic hepatitis B (HCC) CMP and RUQ [place] + elastography ordered. Recommended patient follow up with [facility] at least annually.   Health care maintenance Ordered mammogram, screening colonoscopy.  HTN (hypertension) Chronic, stable, well controlled. Continue current regimen. Will check  CMP today.  Screening for hyperlipidemia Obtain lipid panel and CMP to screen for hyperlipidemia.   Foot pain, bilateral Patient has b/l foot pain and trace edema present at end of day. 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 Recommend supportive shoes (not sandals), compression stockings, elevation after work, and not adding salt to food. F/u if worsened or no improvement.    Annual Examination  See AVS for age appropriate recommendations  PHQ score 0, reviewed and discussed.  BP reviewed and at goal.  Asked about intimate partner violence, denied.  Considered the following items based upon USPSTF recommendations: Diabetes screening: ordered Screening for elevated cholesterol: discussed and ordered HIV testing: discussed and not ordered. Previously negative last year, no new partners, not currently sexually active. Hepatitis C: discussed and previously negative, low risk for new infection. Hepatitis B: discussed. Chronic Hepatitis B, see that problem. Syphilis if at high risk: discussed and low risk, not currently sexually active, previously negative. GC/CT not at high risk and not ordered. Osteoporosis screening considered based upon risk of fracture from [facility] calculator. DEXA not ordered as patient just started menopause in the last year. Reviewed risk factors for latent tuberculosis and declined   Discussed family history, BRCA testing not indicated.  Cervical cancer screening: prior Pap reviewed, repeat due in January 2025. NILM, HPV negative. Breast cancer screening: discussed potential benefits, risks including overdiagnosis and biopsy, elected proceed with mammogram Colorectal cancer  screening: discussed, colonoscopy ordered Lung cancer screening:  not indicated as no history of tobacco use .  Vaccinations prescriptions given for shingles, TDAP. Counseled on COVID vaccine, but patient does not want it yet.  Follow up in 1 year or  sooner if indicated.    Gladys Damme, MD [place]

## 2021-06-15 NOTE — Patient Instructions (Addendum)
It was a pleasure to see you today!  I have placed a referral for colonoscopy. You should receive a phone call in 1-2 weeks to schedule this appointment. If for any reason you do not receive a phone call or need more help scheduling this appointment, please call our office at [phone]. For mammogram, please call [facility] to schedule an appointment at your convenience. To check in on your hepatitis B and liver function, I have ordered an ultrasound of your liver. We will schedule this for you. Follow up will be based on results. I recommend you see the Infectious Disease doctors once a year.  For TDAP and shingles vaccine: take this prescription to any pharmacy that carries these vaccines and you can get them at your convenience. For COVID vaccine: I recommend everyone be vaccinated for COVID. You can get pfizer at this office, or find other options at any pharmacy. Your blood pressure is well controlled! Keep up the good work. For your foot pain: try wearing compression stockings at work and supportive shoes such as [place].    Be Well,  Dr. Leary Roca

## 2021-06-16 ENCOUNTER — Other Ambulatory Visit: Payer: Self-pay

## 2021-06-16 ENCOUNTER — Encounter: Payer: Self-pay | Admitting: Family Medicine

## 2021-06-16 ENCOUNTER — Ambulatory Visit (INDEPENDENT_AMBULATORY_CARE_PROVIDER_SITE_OTHER): Payer: Self-pay | Admitting: Family Medicine

## 2021-06-16 VITALS — BP 128/75 | HR 82 | Ht 65.0 in | Wt 244.4 lb

## 2021-06-16 DIAGNOSIS — E6609 Other obesity due to excess calories: Secondary | ICD-10-CM

## 2021-06-16 DIAGNOSIS — Z23 Encounter for immunization: Secondary | ICD-10-CM

## 2021-06-16 DIAGNOSIS — Z289 Immunization not carried out for unspecified reason: Secondary | ICD-10-CM

## 2021-06-16 DIAGNOSIS — Z6838 Body mass index (BMI) 38.0-38.9, adult: Secondary | ICD-10-CM

## 2021-06-16 DIAGNOSIS — Z131 Encounter for screening for diabetes mellitus: Secondary | ICD-10-CM

## 2021-06-16 DIAGNOSIS — M79671 Pain in right foot: Secondary | ICD-10-CM

## 2021-06-16 DIAGNOSIS — M79672 Pain in left foot: Secondary | ICD-10-CM

## 2021-06-16 DIAGNOSIS — Z13220 Encounter for screening for lipoid disorders: Secondary | ICD-10-CM

## 2021-06-16 DIAGNOSIS — Z1211 Encounter for screening for malignant neoplasm of colon: Secondary | ICD-10-CM

## 2021-06-16 DIAGNOSIS — Z1231 Encounter for screening mammogram for malignant neoplasm of breast: Secondary | ICD-10-CM

## 2021-06-16 DIAGNOSIS — B181 Chronic viral hepatitis B without delta-agent: Secondary | ICD-10-CM

## 2021-06-16 DIAGNOSIS — Z0000 Encounter for general adult medical examination without abnormal findings: Secondary | ICD-10-CM

## 2021-06-16 DIAGNOSIS — I10 Essential (primary) hypertension: Secondary | ICD-10-CM

## 2021-06-16 LAB — POCT GLYCOSYLATED HEMOGLOBIN (HGB A1C): HbA1c, POC (controlled diabetic range): 5.9 % (ref 0.0–7.0)

## 2021-06-16 MED ORDER — TETANUS-DIPHTH-ACELL PERTUSSIS 5-2.5-18.5 LF-MCG/0.5 IM SUSY: 0.5000 mL | PREFILLED_SYRINGE | Freq: Once | INTRAMUSCULAR | 0 refills | Status: AC

## 2021-06-16 MED ORDER — SHINGRIX 50 MCG/0.5ML IM SUSR: 0.5000 mL | Freq: Once | INTRAMUSCULAR | 0 refills | Status: AC

## 2021-06-16 NOTE — Assessment & Plan Note (Signed)
Obtain lipid panel and CMP to screen for hyperlipidemia.

## 2021-06-16 NOTE — Assessment & Plan Note (Signed)
Patient has b/l foot pain and trace edema present at end of day. 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 Recommend supportive shoes (not sandals), compression stockings, elevation after work, and not adding salt to food. F/u if worsened or no improvement.

## 2021-06-16 NOTE — Assessment & Plan Note (Signed)
CMP and RUQ [place] + elastography ordered. Recommended patient follow up with [facility] at least annually.

## 2021-06-16 NOTE — Assessment & Plan Note (Addendum)
Ordered mammogram, screening colonoscopy, given rx for TDAP, shingles. Declined COVID vaccine despite counseling.  A1c WNL, no DM2.

## 2021-06-16 NOTE — Assessment & Plan Note (Signed)
Chronic, stable, well controlled. Continue current regimen. Will check CMP today.

## 2021-06-17 ENCOUNTER — Encounter: Payer: Self-pay | Admitting: Family Medicine

## 2021-06-17 ENCOUNTER — Telehealth: Payer: Self-pay | Admitting: Family Medicine

## 2021-06-17 LAB — COMPREHENSIVE METABOLIC PANEL
ALT: 25 IU/L (ref 0–32)
AST: 17 IU/L (ref 0–40)
Albumin/Globulin Ratio: 1.6 (ref 1.2–2.2)
Albumin: 4.5 g/dL (ref 3.8–4.8)
Alkaline Phosphatase: 87 IU/L (ref 44–121)
BUN/Creatinine Ratio: 14 (ref 9–23)
BUN: 12 mg/dL (ref 6–24)
Bilirubin Total: 0.3 mg/dL (ref 0.0–1.2)
CO2: 27 mmol/L (ref 20–29)
Calcium: 9.9 mg/dL (ref 8.7–10.2)
Chloride: 101 mmol/L (ref 96–106)
Creatinine, Ser: 0.86 mg/dL (ref 0.57–1.00)
Globulin, Total: 2.9 g/dL (ref 1.5–4.5)
Glucose: 126 mg/dL — ABNORMAL HIGH (ref 65–99)
Potassium: 3.4 mmol/L — ABNORMAL LOW (ref 3.5–5.2)
Sodium: 142 mmol/L (ref 134–144)
Total Protein: 7.4 g/dL (ref 6.0–8.5)
eGFR: 82 mL/min/{1.73_m2} (ref >59)

## 2021-06-17 LAB — LIPID PANEL
Chol/HDL Ratio: 4.1 ratio (ref 0.0–4.4)
Cholesterol, Total: 195 mg/dL (ref 100–199)
HDL: 47 mg/dL (ref >39)
LDL Chol Calc (NIH): 120 mg/dL — ABNORMAL HIGH (ref 0–99)
Triglycerides: 157 mg/dL — ABNORMAL HIGH (ref 0–149)
VLDL Cholesterol Cal: 28 mg/dL (ref 5–40)

## 2021-06-17 NOTE — Telephone Encounter (Signed)
Called pt to discuss results of blood work. She has high cholesterol and triglycerides, but does not yet qualify for medication. Best way to treat this is to increase fresh fruit and vegetables as well as exercise such as walking, and decreasing processed carbohydrates like breads/pastas/sweets and fried foods. If she calls back, please give her this information.  Shirlean Mylar, MD [facility], PGY-2

## 2021-06-22 ENCOUNTER — Ambulatory Visit (HOSPITAL_COMMUNITY): Payer: Self-pay

## 2021-07-09 ENCOUNTER — Ambulatory Visit (HOSPITAL_COMMUNITY)
Admission: EM | Admit: 2021-07-09 | Discharge: 2021-07-09 | Disposition: A | Discharge status: Home / Self Care | Payer: Self-pay | Attending: Physician Assistant | Admitting: Physician Assistant

## 2021-07-09 ENCOUNTER — Encounter (HOSPITAL_COMMUNITY): Payer: Self-pay

## 2021-07-09 DIAGNOSIS — R059 Cough, unspecified: Secondary | ICD-10-CM | POA: Insufficient documentation

## 2021-07-09 DIAGNOSIS — R5381 Other malaise: Secondary | ICD-10-CM | POA: Insufficient documentation

## 2021-07-09 DIAGNOSIS — J069 Acute upper respiratory infection, unspecified: Secondary | ICD-10-CM | POA: Insufficient documentation

## 2021-07-09 DIAGNOSIS — R5383 Other fatigue: Secondary | ICD-10-CM | POA: Insufficient documentation

## 2021-07-09 DIAGNOSIS — U071 COVID-19: Secondary | ICD-10-CM | POA: Insufficient documentation

## 2021-07-09 DIAGNOSIS — Z880 Allergy status to penicillin: Secondary | ICD-10-CM | POA: Insufficient documentation

## 2021-07-09 DIAGNOSIS — R0602 Shortness of breath: Secondary | ICD-10-CM | POA: Insufficient documentation

## 2021-07-09 DIAGNOSIS — R6883 Chills (without fever): Secondary | ICD-10-CM | POA: Insufficient documentation

## 2021-07-09 DIAGNOSIS — R52 Pain, unspecified: Secondary | ICD-10-CM | POA: Insufficient documentation

## 2021-07-09 LAB — POC INFLUENZA A AND B ANTIGEN (URGENT CARE ONLY)
INFLUENZA A ANTIGEN, POC: NEGATIVE
INFLUENZA B ANTIGEN, POC: NEGATIVE

## 2021-07-09 MED ORDER — ACETAMINOPHEN 325 MG PO TABS
975.0000 mg | ORAL_TABLET | Freq: Once | ORAL | Status: AC
  Administered 2021-07-09: 975 mg via ORAL

## 2021-07-09 MED ORDER — PROMETHAZINE-DM 6.25-15 MG/5ML PO SYRP: 5.0000 mL | ORAL_SOLUTION | Freq: Three times a day (TID) | ORAL | 0 refills | Status: DC | PRN

## 2021-07-09 MED ORDER — ACETAMINOPHEN 325 MG PO TABS
ORAL_TABLET | ORAL | Status: AC
  Filled 2021-07-09: qty 3

## 2021-07-09 NOTE — ED Provider Notes (Signed)
[facility]    CSN: [id number] Arrival date & time: 07/09/21  1645      History   Chief Complaint Chief Complaint  Patient presents with   Cough   Generalized Body Aches    HPI Stephanie Dorsey is a 50 y.o. female.   Patient presents today with a 1 day history of URI symptoms.  Reports severe body aches, fatigue, cough, shortness of breath with coughing, chills.  She denies chest pain, nausea, vomiting, headache, dizziness.  She has not tried any over-the-counter medication for symptom management.  She does have a history of allergies but is not currently taking any medication for this.  She denies history of asthma, COPD, smoking.  She has not had COVID-19 or influenza vaccinations.  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  She denies any recent antibiotic use.  Reports body ache pain is severe and preventing her from performing daily activities.   Past Medical History:  Diagnosis Date   Hepatitis B    Hypertension     Patient Active Problem List   Diagnosis Date Noted   Screening for hyperlipidemia 06/16/2021   Foot pain, bilateral 06/16/2021   Class 2 obesity due to excess calories without serious comorbidity with body mass index (BMI) of 38.0 to 38.9 in adult 09/19/2017   Health care maintenance 06/01/2016   Chronic hepatitis B (HCC) 05/27/2015   Allergic rhinitis 05/14/2014   Systolic murmur 01/15/2014   HTN (hypertension) 02/06/2013    History reviewed. No pertinent surgical history.  OB History   No obstetric history on file.      Home Medications    Prior to Admission medications   Medication Sig Start Date End Date Taking? Authorizing Provider  promethazine-dextromethorphan (PROMETHAZINE-DM) 6.25-15 MG/5ML syrup Take 5 mLs by mouth 3 (three) times daily as needed for cough. 07/09/21  Yes Dysen Edmondson, Noberto Retort, PA-C  Calcium Carb-Cholecalciferol 500-400 MG-UNIT TABS Take 2 tablets by mouth daily. 01/20/20    Lennox Solders, MD  hydrochlorothiazide (HYDRODIURIL) 25 MG tablet Take 1 tablet (25 mg total) by mouth daily. 11/05/20   Shirlean Mylar, MD  metoprolol succinate (TOPROL-XL) 25 MG 24 hr tablet Take 1 tablet (25 mg total) by mouth daily. 11/23/20   Shirlean Mylar, MD    Family History Family History  Problem Relation Age of Onset   Arthritis Mother    Hyperlipidemia Sister    Arthritis Maternal Grandmother     Social History Social History   Tobacco Use   Smoking status: Never   Smokeless tobacco: Never  Substance Use Topics   Alcohol use: No   Drug use: No     Allergies   Penicillins   Review of Systems Review of Systems  Constitutional:  Positive for activity change, chills and fatigue. Negative for appetite change and fever.  HENT:  Positive for congestion and sore throat. Negative for sinus pressure and sneezing.   Respiratory:  Positive for cough and shortness of breath (with cough).   Cardiovascular:  Negative for chest pain.  Gastrointestinal:  Negative for abdominal pain, diarrhea, nausea and vomiting.  Musculoskeletal:  Positive for arthralgias and myalgias.  Neurological:  Positive for headaches. Negative for dizziness and light-headedness.    Physical Exam Triage Vital Signs ED Triage Vitals  Enc Vitals Group     BP 07/09/21 1801 132/83     Pulse Rate 07/09/21 1801 93     Resp 07/09/21 1801 20  Temp 07/09/21 1801 99.4 F (37.4 C)     Temp Source 07/09/21 1801 Oral     SpO2 07/09/21 1801 99 %     Weight --      Height --      Head Circumference --      Peak Flow --      Pain Score 07/09/21 1759 0     Pain Loc --      Pain Edu? --      Excl. in GC? --    No data found.  Updated Vital Signs BP 132/83 (BP Location: Right Arm)   Pulse 93   Temp 99.4 F (37.4 C) (Oral)   Resp 20   SpO2 99%   Visual Acuity Right Eye Distance:   Left Eye Distance:   Bilateral Distance:    Right Eye Near:   Left Eye Near:    Bilateral Near:      Physical Exam Vitals reviewed.  Constitutional:      General: She is awake. She is not in acute distress.    Appearance: Normal appearance. She is normal weight. She is not ill-appearing.     Comments: Very pleasant female appears stated age in no acute distress  HENT:     Head: Normocephalic and atraumatic.     Right Ear: Tympanic membrane, ear canal and external ear normal. Tympanic membrane is not erythematous or bulging.     Left Ear: Tympanic membrane, ear canal and external ear normal. Tympanic membrane is not erythematous or bulging.     Nose:     Right Sinus: No maxillary sinus tenderness or frontal sinus tenderness.     Left Sinus: No maxillary sinus tenderness or frontal sinus tenderness.     Mouth/Throat:     Pharynx: Uvula midline. No oropharyngeal exudate or posterior oropharyngeal erythema.  Cardiovascular:     Rate and Rhythm: Normal rate and regular rhythm.     Heart sounds: Normal heart sounds, S1 normal and S2 normal. No murmur heard. Pulmonary:     Effort: Pulmonary effort is normal.     Breath sounds: Normal breath sounds. No wheezing, rhonchi or rales.     Comments: Clear to auscultation bilaterally Lymphadenopathy:     Head:     Right side of head: No submental, submandibular or tonsillar adenopathy.     Left side of head: No submental, submandibular or tonsillar adenopathy.     Cervical: No cervical adenopathy.  Psychiatric:        Behavior: Behavior is cooperative.     UC Treatments / Results  Labs (all labs ordered are listed, but only abnormal results are displayed) Labs Reviewed  SARS CORONAVIRUS 2 (TAT 6-24 HRS)  POC INFLUENZA A AND B ANTIGEN (URGENT CARE ONLY)    EKG   Radiology No results found.  Procedures Procedures (including critical care time)  Medications Ordered in UC Medications  acetaminophen (TYLENOL) tablet 975 mg (975 mg Oral Given 07/09/21 1819)    Initial Impression / Assessment and Plan / UC Course  I have reviewed  the triage vital signs and the nursing notes.  Pertinent labs & imaging results that were available during my care of the patient were reviewed by me and considered in my medical decision making (see chart for details).      Flu testing was negative in clinic today.  COVID test is pending.  Discussed likely viral etiology given short duration of symptoms.  Patient was given Tylenol in clinic without improvement  of symptoms.  She was encouraged to alternate Tylenol and ibuprofen to help with fever and pain.  She was prescribed Promethazine DM to be used up to 3 times a day as needed for cough with instruction not to drive or drink alcohol with this medication as drowsiness is a common side effect.  Recommended she use over-the-counter medications including Mucinex and Flonase for symptom relief.  Strict return precautions given to which patient expressed understanding.  Final Clinical Impressions(s) / UC Diagnoses   Final diagnoses:  Upper respiratory tract infection, unspecified type  Cough  Body aches  Malaise and fatigue     Discharge Instructions      Your flu is negative.  We will contact you with COVID-19 results if you are positive.  Use over-the-counter medications including Tylenol ibuprofen for fever and pain.  Take Promethazine DM for cough.  This can make you sleepy do not drive or drink alcohol with it.  Make sure you are drinking plenty of fluid.  If you have any worsening symptoms please return for reevaluation.  Follow-up with your primary care provider within 1 week.     ED Prescriptions     Medication Sig Dispense Auth. Provider   promethazine-dextromethorphan (PROMETHAZINE-DM) 6.25-15 MG/5ML syrup Take 5 mLs by mouth 3 (three) times daily as needed for cough. 118 mL Kristena Wilhelmi K, PA-C      PDMP not reviewed this encounter.   Jeani Hawking, PA-C 07/09/21 1842

## 2021-07-09 NOTE — ED Triage Notes (Signed)
Pt reports cough x 1 day; body aches and fatigue since this morning. Pt has not taken any OTC meds for complaints.

## 2021-07-09 NOTE — Discharge Instructions (Signed)
Your flu is negative.  We will contact you with COVID-19 results if you are positive.  Use over-the-counter medications including Tylenol ibuprofen for fever and pain.  Take Promethazine DM for cough.  This can make you sleepy do not drive or drink alcohol with it.  Make sure you are drinking plenty of fluid.  If you have any worsening symptoms please return for reevaluation.  Follow-up with your primary care provider within 1 week.

## 2021-07-10 LAB — SARS CORONAVIRUS 2 (TAT 6-24 HRS): SARS Coronavirus 2: POSITIVE — AB

## 2021-07-12 ENCOUNTER — Telehealth (HOSPITAL_COMMUNITY): Payer: Self-pay | Admitting: Emergency Medicine

## 2021-07-12 NOTE — Telephone Encounter (Signed)
Tried contact pt at pt request to discuss COVID test results. No answer, Unable to LVM.

## 2021-08-17 ENCOUNTER — Other Ambulatory Visit: Payer: Self-pay | Admitting: Family Medicine

## 2021-08-17 DIAGNOSIS — I10 Essential (primary) hypertension: Secondary | ICD-10-CM

## 2021-09-26 ENCOUNTER — Telehealth: Payer: Self-pay | Admitting: Family Medicine

## 2021-09-26 NOTE — Telephone Encounter (Signed)
Letter sent to patient reminding her to make mammogram appt, RUQ [place] appt, and f/u with infectious disease.  Shirlean Mylar, MD [facility], PGY-3

## 2022-01-04 ENCOUNTER — Ambulatory Visit (HOSPITAL_COMMUNITY)
Admission: EM | Admit: 2022-01-04 | Discharge: 2022-01-04 | Disposition: A | Discharge status: Home / Self Care | Payer: 59 | Attending: Family Medicine | Admitting: Family Medicine

## 2022-01-04 ENCOUNTER — Other Ambulatory Visit: Payer: Self-pay

## 2022-01-04 ENCOUNTER — Encounter (HOSPITAL_COMMUNITY): Payer: Self-pay

## 2022-01-04 DIAGNOSIS — M79642 Pain in left hand: Secondary | ICD-10-CM | POA: Diagnosis not present

## 2022-01-04 DIAGNOSIS — M79641 Pain in right hand: Secondary | ICD-10-CM

## 2022-01-04 DIAGNOSIS — M7702 Medial epicondylitis, left elbow: Secondary | ICD-10-CM | POA: Diagnosis not present

## 2022-01-04 NOTE — ED Triage Notes (Signed)
Pt c/o all joints are sore and painful since Saturday, worsen in past 2 days.

## 2022-01-04 NOTE — Discharge Instructions (Signed)
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  I recommend you take aleve for this pain.  Your elbow pain is likely due to medial epicondylitis.  I recommend you rest the elbow, ice the area, use aleve regularly for pain, and get an elbow brace.  You can get this at your local pharmacy.  If you continue with pain then please see your primary care provider for further instructions/care.

## 2022-01-04 NOTE — ED Provider Notes (Signed)
[facility]    CSN: [id number] Arrival date & time: 01/04/22  1408      History   Chief Complaint Chief Complaint  Patient presents with   Joint Pain    HPI Stephanie Dorsey is a 51 y.o. female.   Patient is here for joint/muscle pain.  Started about a week ago, comes and goes, but worse this weekend.  Has been in her hands;  difficulty holding onto things;  today is also up into the left arm, which is very painful.  Also into the back of the neck.  The joints are not red/swollen.  Pain comes/goes.  Worse with activity.  She is a Conservation officer, nature at [employer].  No meds taken. She has not taken her temp, but feels chills at times.  No uri symptoms noted;  Taking aleve prn, it does help  Past Medical History:  Diagnosis Date   Hepatitis B    Hypertension     Patient Active Problem List   Diagnosis Date Noted   Screening for hyperlipidemia 06/16/2021   Foot pain, bilateral 06/16/2021   Class 2 obesity due to excess calories without serious comorbidity with body mass index (BMI) of 38.0 to 38.9 in adult 09/19/2017   Health care maintenance 06/01/2016   Chronic hepatitis B (HCC) 05/27/2015   Allergic rhinitis 05/14/2014   Systolic murmur 01/15/2014   HTN (hypertension) 02/06/2013    History reviewed. No pertinent surgical history.  OB History   No obstetric history on file.      Home Medications    Prior to Admission medications   Medication Sig Start Date End Date Taking? Authorizing Provider  Calcium Carb-Cholecalciferol 500-400 MG-UNIT TABS Take 2 tablets by mouth daily. 01/20/20   Lennox Solders, MD  hydrochlorothiazide (HYDRODIURIL) 25 MG tablet Take 1 tablet (25 mg total) by mouth daily. Needs appt for future refills 08/17/21   Shirlean Mylar, MD  metoprolol succinate (TOPROL-XL) 25 MG 24 hr tablet Take 1 tablet (25 mg total) by mouth daily. 08/17/21   Shirlean Mylar, MD    Family History Family History  Problem Relation Age of Onset    Arthritis Mother    Hyperlipidemia Sister    Arthritis Maternal Grandmother     Social History Social History   Tobacco Use   Smoking status: Never   Smokeless tobacco: Never  Substance Use Topics   Alcohol use: No   Drug use: No     Allergies   Penicillins   Review of Systems Review of Systems  Constitutional: Negative.   HENT: Negative.    Respiratory: Negative.    Cardiovascular: Negative.   Gastrointestinal: Negative.   Musculoskeletal:  Positive for arthralgias, myalgias and neck pain.  Skin: Negative.     Physical Exam Triage Vital Signs ED Triage Vitals [01/04/22 1440]  Enc Vitals Group     BP (!) 176/82     Pulse Rate 61     Resp 18     Temp 98.9 F (37.2 C)     Temp Source Oral     SpO2 100 %     Weight      Height      Head Circumference      Peak Flow      Pain Score 8     Pain Loc      Pain Edu?      Excl. in GC?    No data found.  Updated Vital Signs BP (!) 176/82 (BP Location: Left Arm)  Pulse 61    Temp 98.9 F (37.2 C) (Oral)    Resp 18    SpO2 100%   Visual Acuity Right Eye Distance:   Left Eye Distance:   Bilateral Distance:    Right Eye Near:   Left Eye Near:    Bilateral Near:     Physical Exam Constitutional:      Appearance: Normal appearance.  Cardiovascular:     Rate and Rhythm: Normal rate and regular rhythm.  Pulmonary:     Effort: Pulmonary effort is normal.     Breath sounds: Normal breath sounds.  Musculoskeletal:        General: Tenderness present. No swelling or deformity.     Left shoulder: Normal.     Right wrist: Normal.     Left wrist: Normal.     Right hand: Normal.     Left hand: Normal.     Cervical back: Normal range of motion.     Comments: No swelling to the hands/fingers;  full rom without limitations; normal strength;  TTP to the medial epicondyle on the left;  pain with resisted supination, pronation, flexion, extension of the wrist;   Neurological:     Mental Status: She is alert.      UC Treatments / Results  Labs (all labs ordered are listed, but only abnormal results are displayed) Labs Reviewed - No data to display  EKG   Radiology No results found.  Procedures Procedures (including critical care time)  Medications Ordered in UC Medications - No data to display  Initial Impression / Assessment and Plan / UC Course  I have reviewed the triage vital signs and the nursing notes.  Pertinent labs & imaging results that were available during my care of the patient were reviewed by me and considered in my medical decision making (see chart for details).  Hand pain is likely from overuse while at work.  Medial epicondylitis discussed today;  see below for further instructions.    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  I recommend you take aleve for this pain.  Your elbow pain is likely due to medial epicondylitis.  I recommend you rest the elbow, ice the area, use aleve regularly for pain, and get an elbow brace.  You can get this at your local pharmacy.  If you continue with pain then please see your primary care provider for further instructions/care.     ED Prescriptions   None    PDMP not reviewed this encounter.   Jannifer Franklin, MD 01/04/22 1510

## 2022-01-13 ENCOUNTER — Ambulatory Visit: Payer: 59 | Admitting: Family Medicine

## 2022-01-13 ENCOUNTER — Encounter: Payer: Self-pay | Admitting: Family Medicine

## 2022-01-13 ENCOUNTER — Other Ambulatory Visit: Payer: Self-pay

## 2022-01-13 VITALS — BP 134/79 | HR 60 | Ht 65.0 in | Wt 243.4 lb

## 2022-01-13 DIAGNOSIS — I10 Essential (primary) hypertension: Secondary | ICD-10-CM

## 2022-01-13 DIAGNOSIS — M25512 Pain in left shoulder: Secondary | ICD-10-CM | POA: Diagnosis not present

## 2022-01-13 MED ORDER — HYDROCHLOROTHIAZIDE 25 MG PO TABS: 25.0000 mg | ORAL_TABLET | Freq: Every day | ORAL | 0 refills | Status: DC

## 2022-01-13 NOTE — Patient Instructions (Addendum)
I am restarting your hydrochlorothiazide medication for your blood pressure.  I want you to get a blood pressure cuff and check your blood pressures at least once daily over the next several weeks.  I am placing an order for you to come back next week to get a lab drawn to check and see how your kidneys are doing with the restarting of the medication.  I think that the pain in your shoulder could possibly be due to nerve irritation as well as the repetitive movements you are doing for work.  Will be important to strengthen other muscles and ensure that we are assisting you with your pain as well as possible.  I recommend alternating Tylenol and ibuprofen as needed for your pain.  I am also can give you some exercises below that you can try out.  If in the next several months there is no improvement or if there is worsening then we can consider doing physical therapy or referring you to sports medicine.     Therapy and Counseling Resources Most providers on this list will take Medicaid. Patients with commercial insurance or Medicare should contact their insurance company to get a list of in network providers.  [facility]  [address] [place], [place], [place] [address], [place] [email or web address] [phone]  [facility]iat[facility] Counseling [address] [facility] [address] [place], [place] [address] [phone]  [facility]  [address] Suite [place], [place] [address]      [phone]  [facility] [address]  [place], [place] [address] [phone]  [facility] [address]  [place], [place] [address]       [phone]     [facility] (virtual only) [phone]  [facility] [address] [place], [place] [phone]  [facility]:  [address] [place] [phone]  [facility]:  [address] Hessie Diener [phone]  [facility] (under & uninsured) [address]   [place] [phone]    [email or web address]     [facility] [address]  [place]    [phone]  [facility] -[address]     Phone:  [phone]     [facility]-  [address]  [phone]   [facility] [address]      [place], [place] [phone]  [facility]: [address] [place], [place], [place]  [phone]   [facility] (Spanish therapist) [email or web address]  [address] [place]  [address]   [place] [address]    [phone]    [facility]   [address],  [place], [place]  [phone]   [facility]  [address] [place]  [phone]  [facility]  [address] [place], [place]        [phone]  Open Access/Walk In Clinic under & uninsured  [facility]  [address] [place], [place] [phone] Crisis [phone]  [facility] [place],  (Spanish)   [address], [place]: [phone]) 8:30 - 12; 1 - 2:30  [facility] [employer],  [address] [place], [place]    [phone]):8:30 - 12; 2 - 3PM  [facility] [employer],  [address]  [place]; [phone]):  Mon - Fri 8 AM - 5 PM  [facility] [address] [place]  MWF 12:30 to 3:00 or call to schedule an appointment  [phone]  Specific Provider options Psychology Today  [email or web address] click on find a therapist  enter your zip code left side and select or tailor a therapist for your specific need.   [facility] Provider Directory [email or web address]  (Medicaid)   Follow all drop down to find a provider  Social Support program [facility] [place] [phone] or  [employer] [address], [place], [place] Recovery support and educational   24- Hour Availability:   [facility]  [address] [place], [place] [phone] Crisis [phone]  [facility] [phone]  [facility]  [phone]   [facility] [place]  [phone] (after hours)  [facility]rapeutic Alternative/Mobile Crisis   [phone]  [place] National Suicide Hotline  [phone] Len Childs)  Call 911 or go to emergency room  [facility]  [phone]);  [place] and [employer]  [phone]); [place], [place], [place], [place], [place], [place], [place]

## 2022-01-13 NOTE — Assessment & Plan Note (Signed)
BP in the office 134/79.  Patient has not been taking medications for at least 1 month.  Patient instructed that she needs to get a blood pressure cuff to check her pressures and follow-up in the next several weeks.  We will hold off on restarting the metoprolol for this time. - Restart HCTZ 25 mg daily - Future order for BMP - Patient to do a daily BP log and bring to clinic in the next 2 weeks

## 2022-01-13 NOTE — Progress Notes (Signed)
SUBJECTIVE:   CHIEF COMPLAINT / HPI:   Hypertension Patient aware that she is not to take any of her medications for the last month.  She initially was discussing that she stopped her metoprolol because it said to eat before she took it and she was not eating very well at the time but then stated she also stopped her HCTZ at some point as well.  Patient was told to see her physician before she restarted any medications.  She does not check her blood pressure at home as she has misplaced her cuff since moving.  Left shoulder pain She also notes that she has been having significant shoulder and hand pain in the left side that has waxed and waned for the last 16 years.  The pain sometimes makes it difficult for her to grip things.  She notes that she has had issues with her hands before and she has previously had steroid injections and had to do physical therapy in the past for it.  She also notes that the pain goes away whenever she pops or shakes out her shoulder.  She has not seen sports medicine or done physical therapy for her shoulder yet.  PERTINENT  PMH / PSH: Reviewed  OBJECTIVE:   BP 134/79    Pulse 60    Ht 5\' 5"  (1.651 m)    Wt 243 lb 6.4 oz (110.4 kg)    SpO2 100%    BMI 40.50 kg/m   General: NAD, well-appearing, well-nourished Respiratory: No respiratory distress, breathing comfortably, able to speak in full sentences Skin: warm and dry, no rashes noted on exposed skin Psych: Appropriate affect and mood Left Shoulder: Inspection reveals no obvious deformity, atrophy, or asymmetry. No bruising. No swelling Palpation says TTP over the [place] [employer] joint and diffusely throughout the shoulder Full ROM in flexion, abduction, internal/external rotation; though patient has pain with the extremes of all ROM NV intact distally Normal scapular function observed. Special Tests:  - Impingement: Positive Hawkins and empty can.  Negative Neer's - Supraspinatous: Positive empty can.  5/5 strength  with resisted flexion at 20 degrees, though significant pain with the movement - Infraspinatous/Teres Minor: 5/5 strength with ER - Subscapularis: 5/5 strength with IR - Biceps tendon: Negative Speeds, positive Yerrgason's  - Labrum: Negative Obriens, negative clunk, good stability   ASSESSMENT/PLAN:   HTN (hypertension) BP in the office 134/79.  Patient has not been taking medications for at least 1 month.  Patient instructed that she needs to get a blood pressure cuff to check her pressures and follow-up in the next several weeks.  We will hold off on restarting the metoprolol for this time. - Restart HCTZ 25 mg daily - Future order for BMP - Patient to do a daily BP log and bring to clinic in the next 2 weeks   Acute on chronic left shoulder pain Physical exam with diffusely positive special tests.  Consideration for a subacromial impingement syndrome with a possible arthritic component given repetitive stress movements.  Counseled patient on using OTC pain medications as needed and gave handout on strengthening exercises and stretches.  Patient to follow-up in the next 2 to 3 months if no improvement or sooner if there is worsening and we may consider physical therapy versus sports medicine referral.  Concern for anxiety Patient's PHQ-9 today was 14 with a negative SI/HI.  Upon further discussion, patient likely has more of an anxiety component related to her symptoms.  She does not want to  start any medications.  She thinks that she would not mind talking to someone about it but she is not sure if she actually would. - Patient provided with therapy resources   Evelena Leyden, DO [facility]

## 2022-04-13 ENCOUNTER — Other Ambulatory Visit: Payer: Self-pay | Admitting: Family Medicine

## 2022-04-13 DIAGNOSIS — I10 Essential (primary) hypertension: Secondary | ICD-10-CM

## 2023-04-05 ENCOUNTER — Encounter: Payer: Medicaid Other | Admitting: Family Medicine

## 2023-04-05 NOTE — Progress Notes (Deleted)
  SUBJECTIVE:   CHIEF COMPLAINT / HPI:   ***  PERTINENT  PMH / PSH: ***  Past Medical History:  Diagnosis Date   Hepatitis B    Hypertension     OBJECTIVE:  There were no vitals taken for this visit.  General: NAD, pleasant, able to participate in exam Cardiac: RRR, no murmurs auscultated Respiratory: CTAB, normal WOB Abdomen: soft, non-tender, non-distended, normoactive bowel sounds Extremities: warm and well perfused, no edema or cyanosis Skin: warm and dry, no rashes noted Neuro: alert, no obvious focal deficits, speech normal Psych: Normal affect and mood  ASSESSMENT/PLAN:   There are no diagnoses linked to this encounter. No orders of the defined types were placed in this encounter.  No follow-ups on file.  Vonna Drafts, MD [facility]

## 2024-08-06 DIAGNOSIS — R1084 Generalized abdominal pain: Secondary | ICD-10-CM | POA: Diagnosis not present

## 2024-08-06 DIAGNOSIS — R197 Diarrhea, unspecified: Secondary | ICD-10-CM | POA: Diagnosis not present

## 2024-08-06 DIAGNOSIS — R110 Nausea: Secondary | ICD-10-CM | POA: Diagnosis not present
